# Patient Record
Sex: Female | Born: 1970 | Race: White | Hispanic: No | Marital: Married | Smoking: Heavy tobacco smoker
Health system: Southern US, Community
[De-identification: ages and names within clinical notes are randomized; demographics above are authoritative.]

## PROBLEM LIST (undated history)

## (undated) DIAGNOSIS — M1990 Unspecified osteoarthritis, unspecified site: Secondary | ICD-10-CM

## (undated) DIAGNOSIS — IMO0002 Reserved for concepts with insufficient information to code with codable children: Secondary | ICD-10-CM

## (undated) DIAGNOSIS — R112 Nausea with vomiting, unspecified: Secondary | ICD-10-CM

## (undated) DIAGNOSIS — Z9889 Other specified postprocedural states: Secondary | ICD-10-CM

## (undated) DIAGNOSIS — J449 Chronic obstructive pulmonary disease, unspecified: Secondary | ICD-10-CM

## (undated) DIAGNOSIS — K219 Gastro-esophageal reflux disease without esophagitis: Secondary | ICD-10-CM

## (undated) HISTORY — DX: Gastro-esophageal reflux disease without esophagitis: K21.9

## (undated) HISTORY — PX: BACK SURGERY: SHX140

## (undated) HISTORY — DX: Unspecified osteoarthritis, unspecified site: M19.90

## (undated) HISTORY — PX: NECK SURGERY: SHX720

## (undated) HISTORY — PX: CHOLECYSTECTOMY: SHX55

## (undated) HISTORY — DX: Reserved for concepts with insufficient information to code with codable children: IMO0002

## (undated) HISTORY — PX: ABDOMINAL HYSTERECTOMY: SHX81

---

## 1998-10-20 ENCOUNTER — Other Ambulatory Visit: Admission: RE | Admit: 1998-10-20 | Discharge: 1998-10-20 | Payer: Self-pay | Admitting: Obstetrics & Gynecology

## 1998-10-26 ENCOUNTER — Ambulatory Visit: Admission: RE | Admit: 1998-10-26 | Discharge: 1998-10-26 | Payer: Self-pay | Admitting: Obstetrics and Gynecology

## 1998-11-30 ENCOUNTER — Inpatient Hospital Stay (HOSPITAL_COMMUNITY): Admission: RE | Admit: 1998-11-30 | Discharge: 1998-12-01 | Payer: Self-pay | Admitting: Obstetrics and Gynecology

## 1999-05-14 ENCOUNTER — Emergency Department (HOSPITAL_COMMUNITY): Admission: EM | Admit: 1999-05-14 | Discharge: 1999-05-14 | Payer: Self-pay

## 2000-02-16 ENCOUNTER — Emergency Department (HOSPITAL_COMMUNITY): Admission: EM | Admit: 2000-02-16 | Discharge: 2000-02-16 | Payer: Self-pay | Admitting: *Deleted

## 2000-05-30 ENCOUNTER — Encounter: Admission: RE | Admit: 2000-05-30 | Discharge: 2000-05-30 | Payer: Self-pay | Admitting: Family Medicine

## 2000-05-30 ENCOUNTER — Encounter: Payer: Self-pay | Admitting: Family Medicine

## 2001-10-08 ENCOUNTER — Inpatient Hospital Stay (HOSPITAL_COMMUNITY): Admission: AD | Admit: 2001-10-08 | Discharge: 2001-10-08 | Payer: Self-pay | Admitting: Obstetrics and Gynecology

## 2002-02-21 ENCOUNTER — Emergency Department (HOSPITAL_COMMUNITY): Admission: EM | Admit: 2002-02-21 | Discharge: 2002-02-21 | Payer: Self-pay | Admitting: *Deleted

## 2002-02-27 ENCOUNTER — Encounter: Payer: Self-pay | Admitting: Orthopedic Surgery

## 2002-02-27 ENCOUNTER — Ambulatory Visit (HOSPITAL_COMMUNITY): Admission: RE | Admit: 2002-02-27 | Discharge: 2002-02-27 | Payer: Self-pay | Admitting: Orthopedic Surgery

## 2002-03-18 ENCOUNTER — Encounter: Payer: Self-pay | Admitting: Neurosurgery

## 2002-03-18 ENCOUNTER — Ambulatory Visit (HOSPITAL_COMMUNITY): Admission: RE | Admit: 2002-03-18 | Discharge: 2002-03-19 | Payer: Self-pay | Admitting: Neurosurgery

## 2002-04-08 ENCOUNTER — Encounter: Payer: Self-pay | Admitting: Neurosurgery

## 2002-04-08 ENCOUNTER — Encounter: Admission: RE | Admit: 2002-04-08 | Discharge: 2002-04-08 | Payer: Self-pay | Admitting: Neurosurgery

## 2002-05-10 ENCOUNTER — Encounter: Admission: RE | Admit: 2002-05-10 | Discharge: 2002-05-20 | Payer: Self-pay | Admitting: Neurosurgery

## 2003-03-03 ENCOUNTER — Other Ambulatory Visit: Admission: RE | Admit: 2003-03-03 | Discharge: 2003-03-03 | Payer: Self-pay | Admitting: Obstetrics and Gynecology

## 2004-01-09 ENCOUNTER — Encounter (INDEPENDENT_AMBULATORY_CARE_PROVIDER_SITE_OTHER): Payer: Self-pay | Admitting: Specialist

## 2004-01-09 ENCOUNTER — Observation Stay (HOSPITAL_COMMUNITY): Admission: RE | Admit: 2004-01-09 | Discharge: 2004-01-10 | Payer: Self-pay

## 2006-03-26 ENCOUNTER — Encounter: Admission: RE | Admit: 2006-03-26 | Discharge: 2006-03-26 | Payer: Self-pay | Admitting: Family Medicine

## 2010-05-08 ENCOUNTER — Emergency Department (HOSPITAL_COMMUNITY): Admission: EM | Admit: 2010-05-08 | Discharge: 2010-05-08 | Payer: Self-pay | Admitting: Emergency Medicine

## 2011-05-17 NOTE — Op Note (Signed)
[place]. [facility]  Patient:    Amber Conrad, Amber Conrad Visit Number: [id number] MRN: [id number]          Service Type: DSU Location: 3000 [id number] Attending Physician:  Gerald Dexter Dictated by:   Reinaldo Meeker, M.D. Admit Date:  03/18/2002 Discharge Date: 03/19/2002                             Operative Report  PREOPERATIVE DIAGNOSIS:  Herniated disk, C5-6, right.  POSTOPERATIVE DIAGNOSIS:  Herniated disk, C5-6, right.  PROCEDURE:  C5-6 anterior cervical diskectomy with bone bank fusion, followed by zephyr anterior cervical plating with operating microscope.  SURGEON:  Reinaldo Meeker, M.D.  ASSISTANT:  Donzetta Sprung. Roney Jaffe., M.D.  PROCEDURE IN DETAIL:  After being placed in the supine position with five pounds traction, the patients neck was prepped and draped in the usual sterile fashion.  ______ were taken prior to incision to identify the appropriate level. A transverse incision was made in the right anterior neck, starting at the midline, and heading towards the medial aspect of the sternocleidomastoid muscle.  The platysma muscle was then incised transversely. The natural fascial plane between the strap muscles medially and the sternocleidomastoid laterally was identified and followed down to the anterior aspect of the cervical spine.  The longus colli muscles were identified and split in the midline and ______.  A self-retaining retractor was placed for exposure.  A second x-ray showed the C5-6 level.  Using the 15 blade, the end of the disk was incised.  Using pituitary rongeurs and Pratts, approximately 95% of the disk material was removed.  A high-speed drill was used to widen the interspace.  The microscope was draped on the field and used through the remainder of the case.  Using microdissection technique, the remainder of the disk material down to the posterior longitudinal ligament was removed.  The wound was then incised  transversely, and the cut edges were removed with Kerrison punch.  Inspection was carried out towards the left asymptomatic side until the proximal nerve root could be identified. Inspection towards the right symptomatic side yielded a number of pieces of herniated disk material, particularly one large one wedged within the foramen, and this was removed to get excellent decompression of the underlying C6 nerve root.  At this point, inspection was carried out in all directions without any evidence of residual compression, and none could be identified.  Large amounts of irrigation was carried out.  Any bleeding was controlled with bipolar coagulation and Gelfoam. Measurements were taken, and 7 mm bone bank plugs were reconstituted.  After irrigating once more, confirming hemostasis, the plug was impacted.  Fluoroscopy showed the plug to be in excellent position. An appropriate length Zephyr anterior cervical plate was then chosen.  Under fluoroscopic guidance, drill holes were placed, followed by placement of 13 mm screws x4.  Final fluoroscopy showed the plate and screws to be in excellent position.  At this point, large amounts of irrigation were carried out.  Any bleeding was controlled with bipolar coagulation.  The wound was then closed using interrupted Vicryl on the platysma muscle and inverted 5-0 PDS in the subcuticular layer and Steri-Strips on the skin.  A sterile dressing and a soft collar were applied.  The patient was extubated and taken to the recovery room in stable condition. Dictated by:   Reinaldo Meeker, M.D. Attending Physician:  Gerald Dexter  DD:  03/18/02 TD:  03/21/02 Job: [id number]

## 2011-05-17 NOTE — Op Note (Signed)
Amber Conrad, Amber Conrad                           ACCOUNT NO.:  [id number]   MEDICAL RECORD NO.:  [id number]                   PATIENT TYPE:  AMB   LOCATION:  DAY                                  FACILITY:  [facility]   PHYSICIAN:  Lorre Munroe., M.D.            DATE OF BIRTH:  05-30-1971   DATE OF PROCEDURE:  01/09/2004  DATE OF DISCHARGE:                                 OPERATIVE REPORT   PREOPERATIVE DIAGNOSIS:  Symptomatic gallstones.   POSTOPERATIVE DIAGNOSIS:  Symptomatic gallstones.   OPERATION:  Laparoscopic cholecystectomy.   SURGEON:  Lebron Conners, M.D.   ASSISTANT:  Anselm Pancoast. Zachery Dakins, M.D.   ANESTHESIA:  General.   DESCRIPTION OF PROCEDURE:  After this patient was monitored and anesthetized  and had routine preparation and draping of the abdomen, I infiltrated local  anesthetic just below the umbilicus, then made a short transverse incision  and dissected down to the midline fascia, which I opened longitudinally.  I  then opened the peritoneal cavity bluntly, swept my finger around to assure  that there were no adhesions of viscera to the anterior abdominal wall, and  placed a 0 Vicryl pursestring suture and secured a Hasson cannula.  After  inflating the abdomen with CO2, I inspected its contents and saw no  abnormalities except for slightly distended, slightly edematous gallbladder.  There were some adhesions of the omentum to the undersurface of the  gallbladder.  After placement of three additional ports through anesthetized  sites under direct vision, I grasped the fundus of the gallbladder and  elevated it toward the right shoulder and took down the adhesions.  I then  noted the cystic duct emerging from the infundibulum of the gallbladder,  pulled it laterally, and dissected a window between the liver and the cystic  duct, and I dissected out the cystic artery.  I noted clearly the emergence  of the cystic duct from the gallbladder and noted its entry into  the common  bile duct.  Since the liver tests were normal and there was no history of  jaundice and the stones were known to be pretty large, I decided not to  perform an operative cholangiogram.  I clipped the cystic duct and cystic  artery with three clips each and cut each between the two clips which were  closest to the gallbladder.  I then put one more clip just  below a fairly  large lymph node which was present.  I dissected the gallbladder from the  liver bed using the hook dissector with cautery and gained hemostasis in the  same way.  Hemostasis was excellent throughout.  After almost completing the  dissection, I checked that the clips were secure, and they appeared to be.  I detached the gallbladder from the liver and briefly irrigated the  operative area and removed the irrigant.  I removed the gallbladder from the  body through the umbilical  incision and tied the pursestring suture.  I then  removed the lateral ports under direct vision.  After allowing the CO2 to  escape, I removed the epigastric port.  I closed all skin incisions with  intracuticular 4-0 Vicryl and Steri-Strips.  The patient was stable through  the procedure.                                               Lorre Munroe., M.D.    Jodi Marble  D:  01/09/2004  T:  01/09/2004  Job:  [id number]

## 2011-06-03 ENCOUNTER — Ambulatory Visit (HOSPITAL_COMMUNITY): Admission: RE | Admit: 2011-06-03 | Payer: BC Managed Care – PPO | Source: Ambulatory Visit | Admitting: General Surgery

## 2012-09-30 ENCOUNTER — Ambulatory Visit
Admission: RE | Admit: 2012-09-30 | Discharge: 2012-09-30 | Disposition: A | Discharge status: Home / Self Care | Payer: BC Managed Care – PPO | Source: Ambulatory Visit | Attending: Family Medicine | Admitting: Family Medicine

## 2012-09-30 ENCOUNTER — Other Ambulatory Visit: Payer: Self-pay | Admitting: Family Medicine

## 2012-09-30 DIAGNOSIS — R079 Chest pain, unspecified: Secondary | ICD-10-CM

## 2012-09-30 DIAGNOSIS — M549 Dorsalgia, unspecified: Secondary | ICD-10-CM

## 2012-09-30 DIAGNOSIS — M25559 Pain in unspecified hip: Secondary | ICD-10-CM

## 2013-11-01 ENCOUNTER — Encounter: Payer: Self-pay | Admitting: Podiatry

## 2013-11-01 ENCOUNTER — Ambulatory Visit (INDEPENDENT_AMBULATORY_CARE_PROVIDER_SITE_OTHER): Payer: BC Managed Care – PPO | Admitting: Podiatry

## 2013-11-01 VITALS — BP 113/64 | HR 62 | Resp 16 | Ht 66.0 in | Wt 186.0 lb

## 2013-11-01 DIAGNOSIS — L600 Ingrowing nail: Secondary | ICD-10-CM

## 2013-11-01 NOTE — Patient Instructions (Signed)

## 2013-11-01 NOTE — Progress Notes (Signed)
  Subjective:    Patient ID: Amber Conrad, female    DOB: 05-Jan-1971, 42 y.o.   MRN: [id number] "I have ingrown toenails on both big toenails that I want done, especially the left one."   HPI Comments: N  Throb, ache, tender, sore L  Ingrown Hallux b/l D  Off and on 5 years O  Gradually  C  Gotten worse A  none T  Had left one removed about 2 yrs. ago, I been cutting them, Dr. Tiburcio Pea been looking at them      Review of Systems  Constitutional: Negative.   HENT: Negative.   Eyes: Negative.   Respiratory: Negative.   Cardiovascular: Negative.   Gastrointestinal: Negative.   Endocrine: Negative.   Genitourinary: Negative.   Musculoskeletal: Positive for arthralgias, back pain and neck stiffness.  Skin: Negative.   Allergic/Immunologic: Negative.   Neurological: Negative.   Hematological: Negative.   Psychiatric/Behavioral: Positive for decreased concentration.       Objective:   Physical Exam Orientated x22 year old white female presents with her husband.  Vascular: The DP and PT pulses are two over four bilaterally. Feet are warm to touch bilaterally. Capillary fill is immediate bilaterally.  Dermatological: Incurvation of the medial and lateral borders the right and left hallux toenails noted.  Musculoskeletal: No deformities noted      Assessment & Plan:  Assessment: Ingrowing medial lateral borders the right and left hallux toenails  Plan: Patient offered permanent correction and she verbally consents. Each right and left hallux toe was blocked with 3 cc of 50-50 mixture of 2% plain Xylocaine and 0.5% plain Marcaine. The toes are prepped with Betadine and exsanguinated. The medial and lateral borders of the right and left hallux toenails are excised and a phenol matricectomy performed. The tourniquets were released and spontaneous capillary filling time noted in the hallux bilaterally. Antibiotic dressing applied. Oral and written instructions provided. Patient has  hydrocodone for ongoing pain and will use if needed for her surgical pain. Reappoint when necessary.  Marchelle Rinella C.Leeanne Deed, DPM

## 2014-08-20 ENCOUNTER — Emergency Department (HOSPITAL_COMMUNITY)
Admission: EM | Admit: 2014-08-20 | Discharge: 2014-08-20 | Disposition: A | Discharge status: Home / Self Care | Payer: BC Managed Care – PPO | Attending: Emergency Medicine | Admitting: Emergency Medicine

## 2014-08-20 ENCOUNTER — Emergency Department (HOSPITAL_COMMUNITY): Payer: BC Managed Care – PPO

## 2014-08-20 ENCOUNTER — Encounter (HOSPITAL_COMMUNITY): Payer: Self-pay | Admitting: Emergency Medicine

## 2014-08-20 DIAGNOSIS — K219 Gastro-esophageal reflux disease without esophagitis: Secondary | ICD-10-CM | POA: Insufficient documentation

## 2014-08-20 DIAGNOSIS — M129 Arthropathy, unspecified: Secondary | ICD-10-CM | POA: Insufficient documentation

## 2014-08-20 DIAGNOSIS — S6990XA Unspecified injury of unspecified wrist, hand and finger(s), initial encounter: Secondary | ICD-10-CM | POA: Insufficient documentation

## 2014-08-20 DIAGNOSIS — S6980XA Other specified injuries of unspecified wrist, hand and finger(s), initial encounter: Secondary | ICD-10-CM | POA: Diagnosis present

## 2014-08-20 DIAGNOSIS — Z872 Personal history of diseases of the skin and subcutaneous tissue: Secondary | ICD-10-CM | POA: Diagnosis not present

## 2014-08-20 DIAGNOSIS — F17200 Nicotine dependence, unspecified, uncomplicated: Secondary | ICD-10-CM | POA: Insufficient documentation

## 2014-08-20 DIAGNOSIS — S62521A Displaced fracture of distal phalanx of right thumb, initial encounter for closed fracture: Secondary | ICD-10-CM

## 2014-08-20 DIAGNOSIS — S62639A Displaced fracture of distal phalanx of unspecified finger, initial encounter for closed fracture: Secondary | ICD-10-CM | POA: Diagnosis not present

## 2014-08-20 DIAGNOSIS — Z79899 Other long term (current) drug therapy: Secondary | ICD-10-CM | POA: Insufficient documentation

## 2014-08-20 DIAGNOSIS — S6991XA Unspecified injury of right wrist, hand and finger(s), initial encounter: Secondary | ICD-10-CM
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
## 2014-08-20 NOTE — Discharge Instructions (Signed)
You have evidence of a broken bone near the end of your right thumb.  However, we cannot rule out a broken bone in your hand.  Wear splint for protection and follow up with hand specialist next week for reevaluation.  Follow instruction below.    Thumb Fracture  There are many types of thumb fractures (breaks). There are different ways of treating these fractures, all of which may be correct, varying from case to case. Your caregiver will discuss different ways to treat these fractures with you. TREATMENT   Immobilization. This means the fracture is casted as it is without changing the positions of the fracture (bone pieces) involved. This fracture is casted in a "thumb spica" also called a hitchhiker cast. It is generally left on for 2 to 6 weeks.  Closed reduction. The bones are manipulated back into position without using surgery.  ORIF (open reduction and internal fixation). The fracture site is opened and the bone pieces are fixed into place with some type of hardware such as screws or wires. Your caregiver will discuss the type of fracture you have and the treatment that will be best for that problem. If surgery is the treatment of choice, the following is information for you to know and to let your caregiver know about prior to surgery. LET YOUR CAREGIVERS KNOW ABOUT:  Allergies.  Medications taken including herbs, eye drops, over the counter medications, and creams.  Use of steroids (by mouth or creams).  Previous problems with anesthetics or Novocain.  Family history of anesthetic complications..  Possibility of pregnancy, if this applies.  History of blood clots (thrombophlebitis).  History of bleeding or blood problems.  Previous surgery.  Other health problems. AFTER THE PROCEDURE  After surgery, you will be taken to the recovery area. A nurse will watch and check your progress. Once you are awake, stable, and taking fluids well, barring other problems you will be  allowed to go home. Once home, an ice pack applied to your operative site may help with discomfort and keep the swelling down. Elevate your hand above your heart as much as possible for the first 4-5 days after the injury/surgery. HOME CARE INSTRUCTIONS   Follow your caregiver's instructions as to activities, exercises, physical therapy, and driving a car.  Use thumb and exercise as directed.  Only take over-the-counter or prescription medicines for pain, discomfort, or fever as directed by your caregiver. Do not take aspirin until your caregiver instructs. This can increase bleeding immediately following surgery. SEEK MEDICAL CARE IF:   There is increased bleeding (more than a small spot) from the wound or from beneath your cast or splint.  There is redness, swelling, or increasing pain in the wound or from beneath your cast or splint.  You have pus coming from wound or from beneath your cast or splint.  An unexplained oral temperature above 102 F (38.9 C) develops.  There is a foul smell coming from the wound or dressing or from beneath your cast or splint. SEEK IMMEDIATE MEDICAL CARE IF:   You develop severe pain, decreased sensation such as numbness or tingling.  You develop a rash.  You have difficulty breathing.  Youhave any allergic problems. If you do not have a window in your cast for observing the wound, a discharge or minor bleeding may show up as a stain on the outside of your cast. Report these findings to your caregiver. If you have a removable splint overlying the surgical dressings it is common to  see a small amount of bleeding. Change the dressings as instructed by your caregiver. Document Released: 09/14/2003 Document Revised: 03/09/2012 Document Reviewed: 02/18/2014 [facility] Patient Information 2015 [place], [place]. This information is not intended to replace advice given to you by your health care provider. Make sure you discuss any questions you have with your  health care provider.
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
## 2014-08-30 NOTE — ED Provider Notes (Signed)
Medical screening examination/treatment/procedure(s) were performed by non-physician practitioner and as supervising physician I was immediately available for consultation/collaboration.   EKG Interpretation None       Babette Relic, MD 08/30/14 2045

## 2015-01-25 ENCOUNTER — Other Ambulatory Visit: Payer: Self-pay | Admitting: Family Medicine

## 2015-01-25 DIAGNOSIS — R1030 Lower abdominal pain, unspecified: Secondary | ICD-10-CM

## 2015-01-26 ENCOUNTER — Ambulatory Visit
Admission: RE | Admit: 2015-01-26 | Discharge: 2015-01-26 | Disposition: A | Discharge status: Home / Self Care | Payer: BLUE CROSS/BLUE SHIELD | Source: Ambulatory Visit | Attending: Family Medicine | Admitting: Family Medicine

## 2015-01-26 DIAGNOSIS — R1030 Lower abdominal pain, unspecified: Secondary | ICD-10-CM

## 2015-01-30 ENCOUNTER — Other Ambulatory Visit: Payer: Self-pay | Admitting: Gastroenterology

## 2015-01-30 ENCOUNTER — Other Ambulatory Visit: Payer: Self-pay | Admitting: Family Medicine

## 2015-01-30 DIAGNOSIS — D3500 Benign neoplasm of unspecified adrenal gland: Secondary | ICD-10-CM

## 2015-02-01 ENCOUNTER — Ambulatory Visit
Admission: RE | Admit: 2015-02-01 | Discharge: 2015-02-01 | Disposition: A | Discharge status: Home / Self Care | Payer: BLUE CROSS/BLUE SHIELD | Source: Ambulatory Visit | Attending: Family Medicine | Admitting: Family Medicine

## 2015-02-01 DIAGNOSIS — D3500 Benign neoplasm of unspecified adrenal gland: Secondary | ICD-10-CM

## 2019-06-03 ENCOUNTER — Other Ambulatory Visit: Payer: Self-pay | Admitting: Sports Medicine

## 2019-06-03 DIAGNOSIS — M545 Low back pain, unspecified: Secondary | ICD-10-CM

## 2019-06-18 ENCOUNTER — Ambulatory Visit
Admission: RE | Admit: 2019-06-18 | Discharge: 2019-06-18 | Disposition: A | Discharge status: Home / Self Care | Payer: BLUE CROSS/BLUE SHIELD | Source: Ambulatory Visit | Attending: Sports Medicine | Admitting: Sports Medicine

## 2019-06-18 ENCOUNTER — Other Ambulatory Visit: Payer: Self-pay

## 2019-06-18 DIAGNOSIS — M545 Low back pain, unspecified: Secondary | ICD-10-CM

## 2019-08-06 ENCOUNTER — Other Ambulatory Visit: Payer: Self-pay | Admitting: Neurological Surgery

## 2019-08-06 DIAGNOSIS — M542 Cervicalgia: Secondary | ICD-10-CM

## 2019-08-12 ENCOUNTER — Other Ambulatory Visit: Payer: Self-pay | Admitting: Family Medicine

## 2019-08-18 ENCOUNTER — Other Ambulatory Visit: Payer: Self-pay | Admitting: Family Medicine

## 2019-08-18 DIAGNOSIS — R221 Localized swelling, mass and lump, neck: Secondary | ICD-10-CM

## 2019-08-20 ENCOUNTER — Ambulatory Visit
Admission: RE | Admit: 2019-08-20 | Discharge: 2019-08-20 | Disposition: A | Discharge status: Home / Self Care | Payer: 59 | Source: Ambulatory Visit | Attending: Family Medicine | Admitting: Family Medicine

## 2019-08-20 DIAGNOSIS — R221 Localized swelling, mass and lump, neck: Secondary | ICD-10-CM

## 2019-08-20 MED ORDER — IOPAMIDOL (ISOVUE-300) INJECTION 61%
75.0000 mL | Freq: Once | INTRAVENOUS | Status: AC | PRN
  Administered 2019-08-20: 75 mL via INTRAVENOUS

## 2019-09-04 ENCOUNTER — Other Ambulatory Visit: Payer: Self-pay

## 2019-09-04 ENCOUNTER — Ambulatory Visit
Admission: RE | Admit: 2019-09-04 | Discharge: 2019-09-04 | Disposition: A | Discharge status: Home / Self Care | Payer: 59 | Source: Ambulatory Visit | Attending: Neurological Surgery | Admitting: Neurological Surgery

## 2019-09-04 DIAGNOSIS — M542 Cervicalgia: Secondary | ICD-10-CM

## 2019-10-18 ENCOUNTER — Other Ambulatory Visit: Payer: Self-pay | Admitting: Neurological Surgery

## 2019-10-18 DIAGNOSIS — M5414 Radiculopathy, thoracic region: Secondary | ICD-10-CM

## 2019-10-26 ENCOUNTER — Other Ambulatory Visit: Payer: 59

## 2019-10-28 ENCOUNTER — Other Ambulatory Visit: Payer: 59

## 2019-11-06 ENCOUNTER — Other Ambulatory Visit: Payer: Self-pay

## 2019-11-06 ENCOUNTER — Ambulatory Visit
Admission: RE | Admit: 2019-11-06 | Discharge: 2019-11-06 | Disposition: A | Discharge status: Home / Self Care | Payer: 59 | Source: Ambulatory Visit | Attending: Neurological Surgery | Admitting: Neurological Surgery

## 2019-11-06 DIAGNOSIS — M5414 Radiculopathy, thoracic region: Secondary | ICD-10-CM

## 2019-11-09 ENCOUNTER — Other Ambulatory Visit: Payer: 59

## 2020-06-08 ENCOUNTER — Other Ambulatory Visit: Payer: Self-pay | Admitting: Family Medicine

## 2020-06-08 DIAGNOSIS — K6289 Other specified diseases of anus and rectum: Secondary | ICD-10-CM

## 2020-06-08 DIAGNOSIS — R0989 Other specified symptoms and signs involving the circulatory and respiratory systems: Secondary | ICD-10-CM

## 2020-06-26 ENCOUNTER — Other Ambulatory Visit: Payer: Self-pay

## 2020-06-26 ENCOUNTER — Ambulatory Visit
Admission: RE | Admit: 2020-06-26 | Discharge: 2020-06-26 | Disposition: A | Discharge status: Home / Self Care | Payer: 59 | Source: Ambulatory Visit | Attending: Family Medicine | Admitting: Family Medicine

## 2020-06-26 DIAGNOSIS — R0989 Other specified symptoms and signs involving the circulatory and respiratory systems: Secondary | ICD-10-CM

## 2020-06-26 MED ORDER — IOPAMIDOL (ISOVUE-300) INJECTION 61%
75.0000 mL | Freq: Once | INTRAVENOUS | Status: AC | PRN
  Administered 2020-06-26: 75 mL via INTRAVENOUS

## 2020-10-11 ENCOUNTER — Emergency Department (HOSPITAL_COMMUNITY)
Admission: EM | Admit: 2020-10-11 | Discharge: 2020-10-12 | Disposition: A | Discharge status: Home / Self Care | Payer: Self-pay | Attending: Emergency Medicine | Admitting: Emergency Medicine

## 2020-10-11 DIAGNOSIS — M791 Myalgia, unspecified site: Secondary | ICD-10-CM | POA: Insufficient documentation

## 2020-10-11 DIAGNOSIS — M542 Cervicalgia: Secondary | ICD-10-CM | POA: Insufficient documentation

## 2020-10-11 DIAGNOSIS — M549 Dorsalgia, unspecified: Secondary | ICD-10-CM | POA: Insufficient documentation

## 2020-10-11 DIAGNOSIS — Z5321 Procedure and treatment not carried out due to patient leaving prior to being seen by health care provider: Secondary | ICD-10-CM | POA: Insufficient documentation

## 2020-10-12 ENCOUNTER — Other Ambulatory Visit: Payer: Self-pay

## 2020-10-12 ENCOUNTER — Encounter (HOSPITAL_COMMUNITY): Payer: Self-pay | Admitting: *Deleted

## 2020-10-12 NOTE — ED Triage Notes (Signed)
The pt is c/o back pain lumbar spine pain neck pain since Sunday. No injury  She asked the wait time  She reports that she is not going to wait for hours and hours  She left without being seen

## 2021-12-03 ENCOUNTER — Other Ambulatory Visit: Payer: Self-pay | Admitting: Family Medicine

## 2021-12-03 DIAGNOSIS — M542 Cervicalgia: Secondary | ICD-10-CM

## 2021-12-11 ENCOUNTER — Other Ambulatory Visit: Payer: Self-pay

## 2021-12-11 ENCOUNTER — Ambulatory Visit
Admission: RE | Admit: 2021-12-11 | Discharge: 2021-12-11 | Disposition: A | Discharge status: Home / Self Care | Payer: Medicare HMO | Source: Ambulatory Visit | Attending: Family Medicine | Admitting: Family Medicine

## 2021-12-11 DIAGNOSIS — M542 Cervicalgia: Secondary | ICD-10-CM

## 2022-06-24 IMAGING — MR MR CERVICAL SPINE W/O CM
5 of 7 series · 33 of 48 positions shown · non-contrast
Comparison: MRI of the cervical spine 09/04/2019

CLINICAL DATA: Posterior neck pain.  Left-sided neck pain.

EXAM:
MRI CERVICAL SPINE WITHOUT CONTRAST
TECHNIQUE: Multiplanar, multisequence MR imaging of the cervical spine was
performed. No intravenous contrast was administered.

[Series 5: T2 · sagittal · 3.0mm · 0.66mm/px · 6 of 18 slices shown (1 of 3)]
[im 1/18]
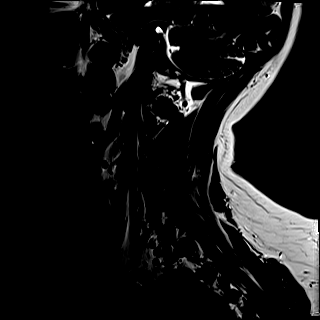
[im 4/18]
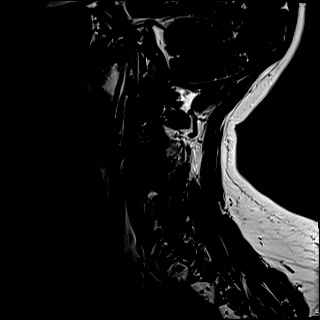
[im 7/18]
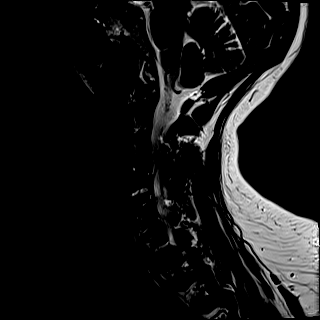
[im 11/18]
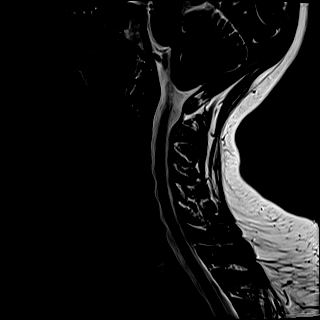
[im 14/18]
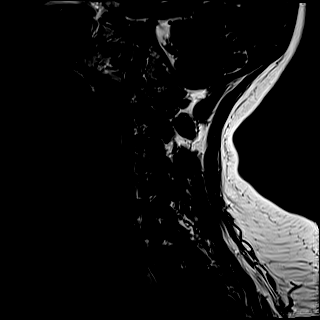
[im 18/18]
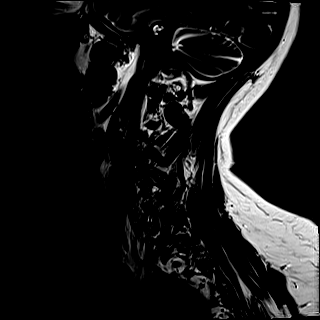

[Series 6: T1 · sagittal · 3.0mm · 0.66mm/px · 5 of 18 slices shown]
[im 1/18]
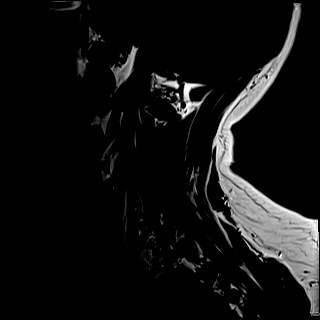
[im 5/18]
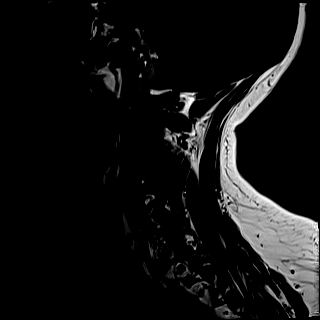
[im 9/18]
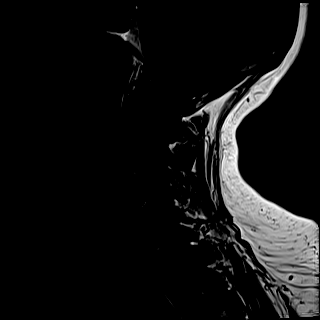
[im 13/18]
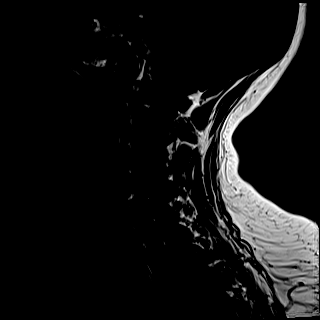
[im 18/18]
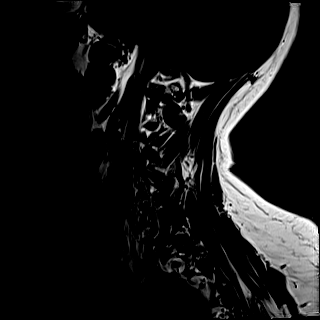

[Series 7: STIR · sagittal · 3.0mm · 0.33mm/px · 4 of 18 slices shown]
[im 1/18]
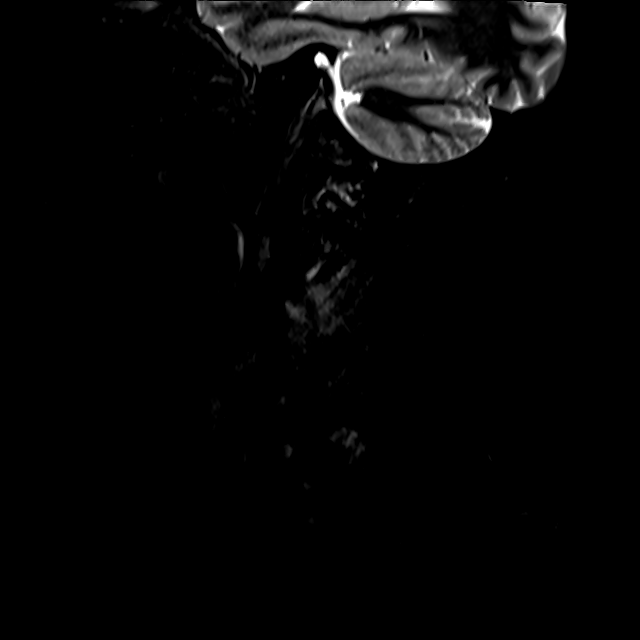
[im 5/18]
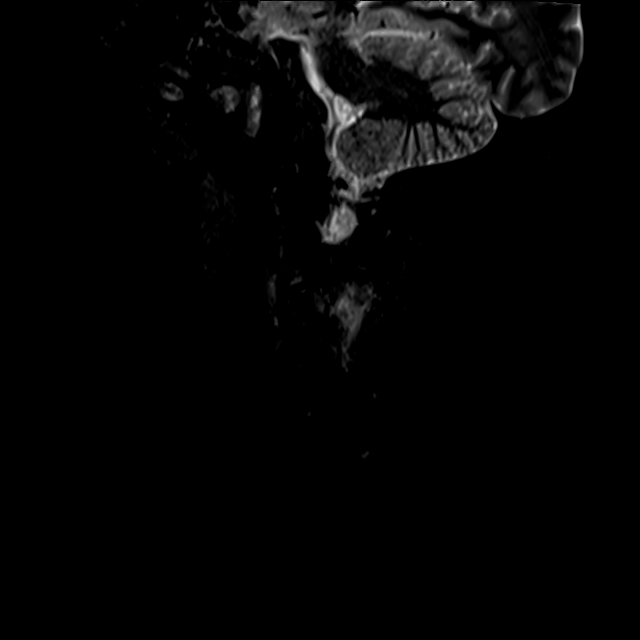
[im 9/18]
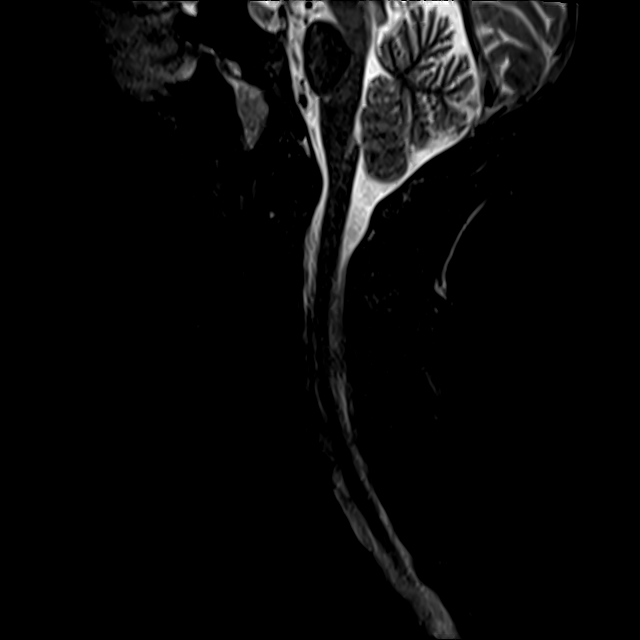
[im 13/18]
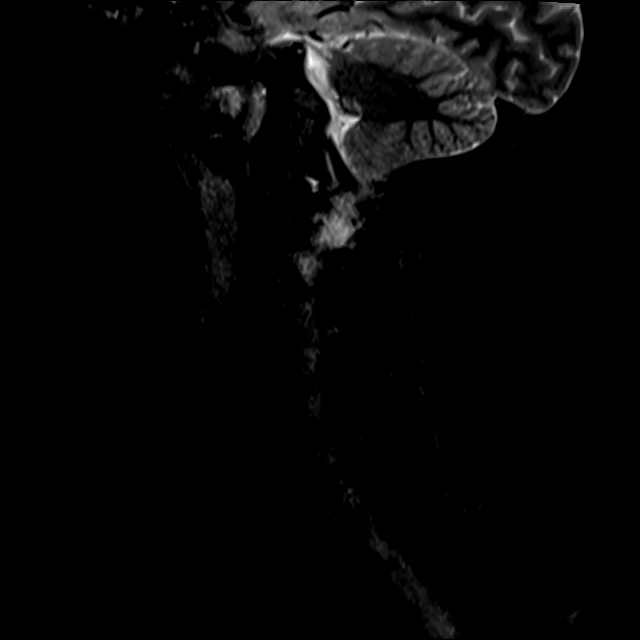

[Series 8: T2 · axial · 3.0mm · 0.50mm/px · z∈[-85,+15]mm · 9 of 32 slices shown (2 of 3)]
[im 1/32]
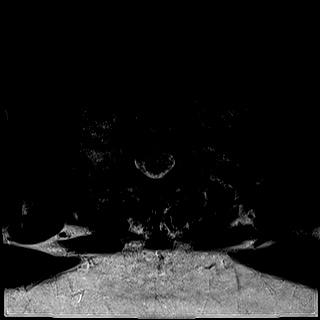
[im 4/32]
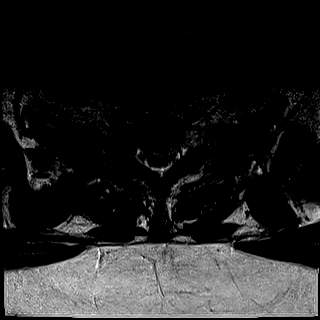
[im 8/32]
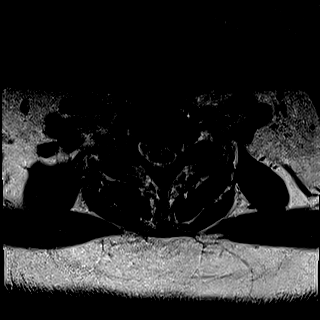
[im 12/32]
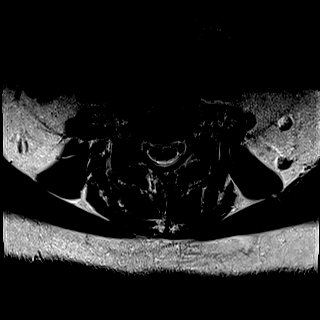
[im 16/32]
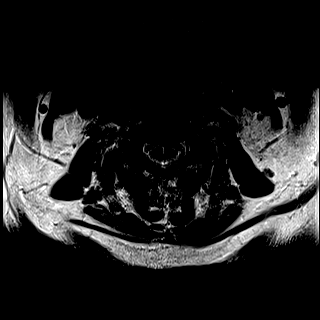
[im 20/32]
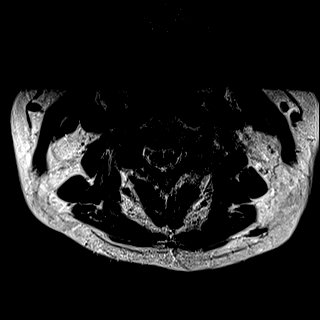
[im 24/32]
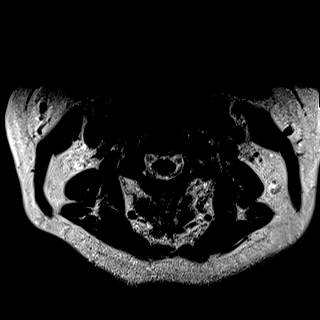
[im 28/32]
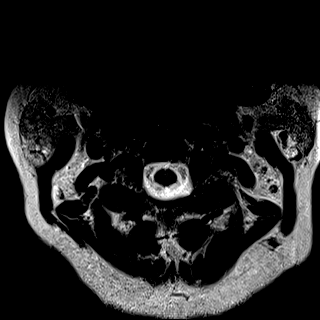
[im 32/32]
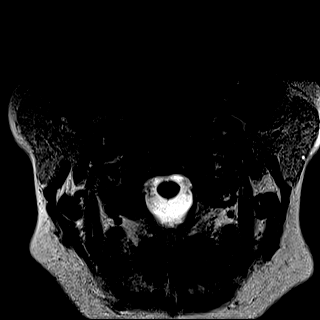

[Series 11: T2 · axial · 3.0mm · 0.50mm/px · z∈[-85,+15]mm · 9 of 32 slices shown (3 of 3)]
[im 1/32]
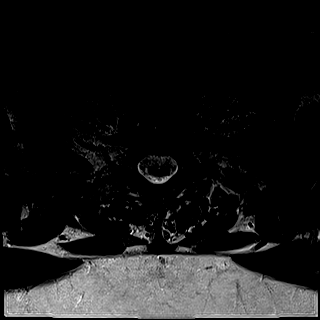
[im 4/32]
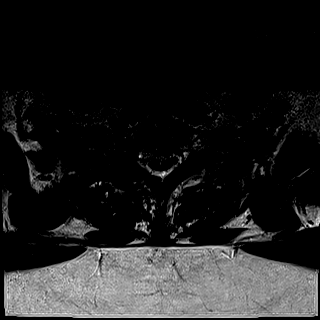
[im 8/32]
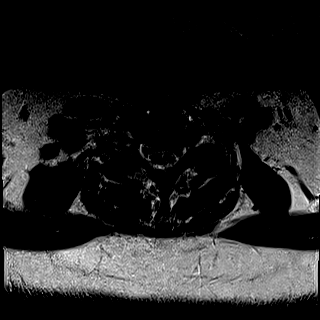
[im 12/32]
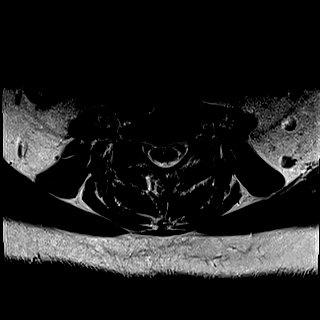
[im 16/32]
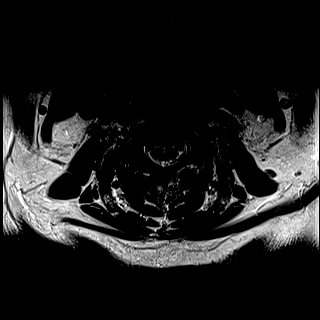
[im 20/32]
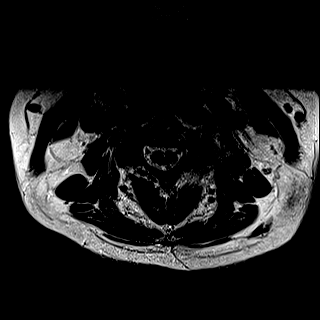
[im 24/32]
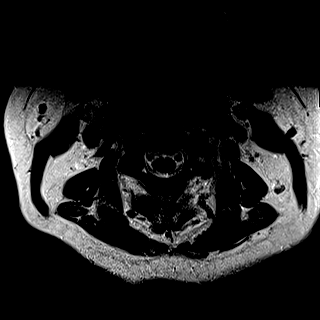
[im 28/32]
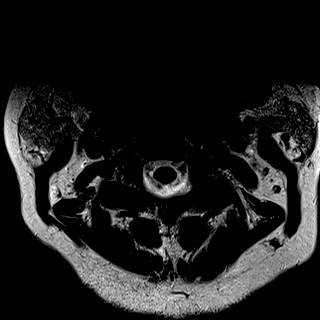
[im 32/32]
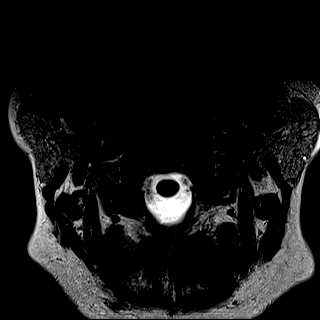

[33 of 48 positions shown; findings below may reference images not displayed]

FINDINGS: Alignment: No significant listhesis is present. Cervical lordosis is
preserved.

Vertebrae: T1 hemangioma again noted. Chronic fatty endplate marrow
changes C6-7 are stable. Marrow signal and vertebral body heights
are otherwise normal.

Cord: Normal signal and morphology.

Posterior Fossa, vertebral arteries, paraspinal tissues: The
cerebellar tonsils extend to the foramen magnum. A rounded. Adequate
CSF space surrounds. No significant Chiari malformation.
Craniocervical junction is otherwise normal. Flow is present in the
vertebral arteries bilaterally. Visualized intracranial contents are
normal.

Disc levels:

C2-3: Asymmetric left-sided uncovertebral and facet hypertrophy
results in moderate left foraminal narrowing.

C3-4: A broad-based disc osteophyte complex is present.
Uncovertebral and facet hypertrophy results in moderate left
foraminal narrowing.

C4-5: A broad-based disc osteophyte complex present. Stable mild
foraminal narrowing is due to uncovertebral disease.

C5-6: Solid anterior fusion is present. No residual or recurrent
stenosis is present.

C6-7: A leftward disc osteophyte complex is present. Progressive
moderate to severe left foraminal narrowing is present. Partial
effacement of the ventral CSF is noted. Mild right foraminal
narrowing is stable.

C7-T1: Negative.
IMPRESSION: 1. Solid anterior fusion at C5-6 without residual or recurrent
stenosis.
2. Progressive moderate to severe left foraminal stenosis at C6-7
secondary to a leftward disc osteophyte complex.
3. Stable moderate left foraminal narrowing at C2-3 and C3-4.
4. Stable mild foraminal narrowing bilaterally at C4-5.

## 2022-08-29 ENCOUNTER — Other Ambulatory Visit: Payer: Self-pay | Admitting: Neurological Surgery

## 2022-08-29 DIAGNOSIS — M5416 Radiculopathy, lumbar region: Secondary | ICD-10-CM

## 2022-09-18 ENCOUNTER — Ambulatory Visit
Admission: RE | Admit: 2022-09-18 | Discharge: 2022-09-18 | Disposition: A | Discharge status: Home / Self Care | Payer: Medicare HMO | Source: Ambulatory Visit | Attending: Neurological Surgery | Admitting: Neurological Surgery

## 2022-09-18 DIAGNOSIS — M5416 Radiculopathy, lumbar region: Secondary | ICD-10-CM

## 2022-09-18 MED ORDER — GADOBENATE DIMEGLUMINE 529 MG/ML IV SOLN
20.0000 mL | Freq: Once | INTRAVENOUS | Status: AC | PRN
  Administered 2022-09-18: 20 mL via INTRAVENOUS

## 2022-10-01 ENCOUNTER — Other Ambulatory Visit: Payer: Self-pay | Admitting: Neurological Surgery

## 2022-10-09 NOTE — Pre-Procedure Instructions (Signed)
Surgical Instructions    Your procedure is scheduled on Monday, October 16.  Report to [facility] Main Entrance "A" at 5:30 A.M., then check in with the Admitting office.  Call this number if you have problems the morning of surgery:  [phone]   If you have any questions prior to your surgery date call [phone]: Open Monday-Friday 8am-4pm If you experience any cold or flu symptoms such as cough, fever, chills, shortness of breath, etc. between now and your scheduled surgery, please notify us at the above number     Remember:  Do not eat or drink after midnight the night before your surgery    Take these medicines the morning of surgery with A SIP OF WATER:  amoxicillin-clavulanate (AUGMENTIN)  omeprazole (PRILOSEC)  Budeson-Glycopyrrol-Formoterol (BREZTRI AEROSPHERE)  albuterol (VENTOLIN HFA) 108 (90 Base) MCG/ACT inhaler if needed doxycycline (VIBRA-TABS)  if needed HYDROcodone-acetaminophen ([place])  if needed   As of today, STOP taking any Aspirin (unless otherwise instructed by your surgeon), meloxicam (MOBIC), Aleve, Naproxen, Ibuprofen, Motrin, Advil, Goody's, BC's, all herbal medications, fish oil, and all vitamins.   [facility] is not responsible for any belongings or valuables.    Do NOT Smoke (Tobacco/Vaping)  24 hours prior to your procedure  If you use a CPAP at night, you may bring your mask for your overnight stay.   Contacts, glasses, hearing aids, dentures or partials may not be worn into surgery, please bring cases for these belongings   For patients admitted to the hospital, discharge time will be determined by your treatment team.   Patients discharged the day of surgery will not be allowed to drive home, and someone needs to stay with them for 24 hours.   SURGICAL WAITING ROOM VISITATION Patients having surgery or a procedure may have no more than 2 support people in the waiting area - these visitors may rotate.   Children under the age of 24  must have an adult with them who is not the patient. If the patient needs to stay at the hospital during part of their recovery, the visitor guidelines for inpatient rooms apply. Pre-op nurse will coordinate an appropriate time for 1 support person to accompany patient in pre-op.  This support person may not rotate.   Please refer to the [facility] website for the visitor guidelines for Inpatients (after your surgery is over and you are in a regular room).    Special instructions:    Oral Hygiene is also important to reduce your risk of infection.  Remember - BRUSH YOUR TEETH THE MORNING OF SURGERY WITH YOUR REGULAR TOOTHPASTE   [place]- Preparing For Surgery  Before surgery, you can play an important role. Because skin is not sterile, your skin needs to be as free of germs as possible. You can reduce the number of germs on your skin by washing with CHG (chlorahexidine gluconate) Soap before surgery.  CHG is an antiseptic cleaner which kills germs and bonds with the skin to continue killing germs even after washing.     Please do not use if you have an allergy to CHG or antibacterial soaps. If your skin becomes reddened/irritated stop using the CHG.  Do not shave (including legs and underarms) for at least 48 hours prior to first CHG shower. It is OK to shave your face.  Please follow these instructions carefully.     Shower the NIGHT BEFORE SURGERY and the MORNING OF SURGERY with CHG Soap.   If you chose to wash  your hair, wash your hair first as usual with your normal shampoo. After you shampoo, rinse your hair and body thoroughly to remove the shampoo.  Then [employer] and genitals (private parts) with your normal soap and rinse thoroughly to remove soap.  After that Use CHG Soap as you would any other liquid soap. You can apply CHG directly to the skin and wash gently with a scrungie or a clean washcloth.   Apply the CHG Soap to your body ONLY FROM THE NECK DOWN.  Do not use on  open wounds or open sores. Avoid contact with your eyes, ears, mouth and genitals (private parts). Wash Face and genitals (private parts)  with your normal soap.   Wash thoroughly, paying special attention to the area where your surgery will be performed.  Thoroughly rinse your body with warm water from the neck down.  DO NOT shower/wash with your normal soap after using and rinsing off the CHG Soap.  Pat yourself dry with a CLEAN TOWEL.  Wear CLEAN PAJAMAS to bed the night before surgery  Place CLEAN SHEETS on your bed the night before your surgery  DO NOT SLEEP WITH PETS.   Day of Surgery:  Take a shower with CHG soap. Wear Clean/Comfortable clothing the morning of surgery Do not wear jewelry or makeup. Do not wear lotions, powders, perfumes/cologne or deodorant. Do not shave 48 hours prior to surgery.  Men may shave face and neck. Do not bring valuables to the hospital. Do not wear nail polish, gel polish, artificial nails, or any other type of covering on natural nails (fingers and toes) If you have artificial nails or gel coating that need to be removed by a nail salon, please have this removed prior to surgery. Artificial nails or gel coating may interfere with anesthesia's ability to adequately monitor your vital signs. Remember to brush your teeth WITH YOUR REGULAR TOOTHPASTE.    If you received a COVID test during your pre-op visit, it is requested that you wear a mask when out in public, stay away from anyone that may not be feeling well, and notify your surgeon if you develop symptoms. If you have been in contact with anyone that has tested positive in the last 10 days, please notify your surgeon.    Please read over the following fact sheets that you were given.

## 2022-10-10 ENCOUNTER — Other Ambulatory Visit: Payer: Self-pay

## 2022-10-10 ENCOUNTER — Encounter (HOSPITAL_COMMUNITY): Payer: Self-pay

## 2022-10-10 ENCOUNTER — Encounter (HOSPITAL_COMMUNITY)
Admission: RE | Admit: 2022-10-10 | Discharge: 2022-10-10 | Disposition: A | Discharge status: Home / Self Care | Payer: Medicare HMO | Source: Ambulatory Visit | Attending: Neurological Surgery | Admitting: Neurological Surgery

## 2022-10-10 VITALS — BP 114/71 | HR 70 | Temp 98.1°F | Resp 17 | Ht 66.0 in | Wt 229.4 lb

## 2022-10-10 DIAGNOSIS — F17200 Nicotine dependence, unspecified, uncomplicated: Secondary | ICD-10-CM | POA: Diagnosis not present

## 2022-10-10 DIAGNOSIS — J439 Emphysema, unspecified: Secondary | ICD-10-CM | POA: Diagnosis not present

## 2022-10-10 DIAGNOSIS — Z01812 Encounter for preprocedural laboratory examination: Secondary | ICD-10-CM | POA: Diagnosis present

## 2022-10-10 DIAGNOSIS — Z01818 Encounter for other preprocedural examination: Secondary | ICD-10-CM

## 2022-10-10 HISTORY — DX: Nausea with vomiting, unspecified: R11.2

## 2022-10-10 HISTORY — DX: Nausea with vomiting, unspecified: Z98.890

## 2022-10-10 HISTORY — DX: Other specified postprocedural states: Z98.890

## 2022-10-10 HISTORY — DX: Chronic obstructive pulmonary disease, unspecified: J44.9

## 2022-10-10 LAB — CBC
HCT: 46.7 % — ABNORMAL HIGH (ref 36.0–46.0)
Hemoglobin: 15.2 g/dL — ABNORMAL HIGH (ref 12.0–15.0)
MCH: 29.2 pg (ref 26.0–34.0)
MCHC: 32.5 g/dL (ref 30.0–36.0)
MCV: 89.8 fL (ref 80.0–100.0)
Platelets: 526 10*3/uL — ABNORMAL HIGH (ref 150–400)
RBC: 5.2 MIL/uL — ABNORMAL HIGH (ref 3.87–5.11)
RDW: 13.8 % (ref 11.5–15.5)
WBC: 15 10*3/uL — ABNORMAL HIGH (ref 4.0–10.5)
nRBC: 0 % (ref 0.0–0.2)

## 2022-10-10 LAB — SURGICAL PCR SCREEN
MRSA, PCR: NEGATIVE
Staphylococcus aureus: NEGATIVE

## 2022-10-10 LAB — PROTIME-INR
INR: 1 (ref 0.8–1.2)
Prothrombin Time: 12.7 seconds (ref 11.4–15.2)

## 2022-10-10 NOTE — Progress Notes (Addendum)
PCP - Claris Gower MD Cardiologist - denies  PPM/ICD - denies  Chest x-ray - n/a EKG - n/a Stress Test - "years and years and years ago with Dr. Wynonia Lawman" ECHO - denies Cardiac Cath - denies  Sleep Study - denies  No diabetes.  As of today, STOP taking any Aspirin (unless otherwise instructed by your surgeon), meloxicam (MOBIC), Aleve, Naproxen, Ibuprofen, Motrin, Advil, Goody's, BC's, all herbal medications, fish oil, and all vitamins.  ERAS Protcol - no PRE-SURGERY Ensure or G2- n/a  COVID TEST- n/a   Anesthesia review: Yes- WBC 15. Pt endorses taking augmentin for a cold. Course to complete 10/15  Patient denies shortness of breath, fever, cough and chest pain at PAT appointment   All instructions explained to the patient, with a verbal understanding of the material. Patient agrees to go over the instructions while at home for a better understanding. Patient also instructed to self quarantine after being tested for COVID-19. The opportunity to ask questions was provided.

## 2022-10-11 NOTE — Anesthesia Preprocedure Evaluation (Signed)
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  Reproductive/Obstetrics negative OB ROS                           Anesthesia Physical Anesthesia Plan  ASA: 3  Anesthesia Plan: General   Post-op Pain Management: Tylenol PO (pre-op)* and Ketamine IV*   Induction: Intravenous  PONV Risk Score and Plan: 4 or greater and Treatment may vary due to age or medical condition, Ondansetron, Dexamethasone, Midazolam and Scopolamine patch - Pre-op  Airway Management Planned: Oral ETT  Additional Equipment: None  Intra-op Plan:   Post-operative Plan: Extubation in OR  Informed Consent: I have reviewed the patients History and Physical, chart, labs and discussed the procedure including the risks, benefits and alternatives for the proposed anesthesia with the patient or authorized representative who has indicated his/her understanding and acceptance.     Dental advisory given  Plan Discussed with: CRNA  Anesthesia Plan Comments: (See PAT note 10/11/22 by Karoline Caldwell, PA-C )       Anesthesia Quick Evaluation

## 2022-10-11 NOTE — Progress Notes (Signed)
Anesthesia Chart Review:  I called and spoke with the patient regarding her recent respiratory illness.  She states that 2 to 3 weeks ago she developed a productive cough and after about 5 to 7 days of this she was seen by her PCP on 10/07/2022 and started on a 7-day course of Augmentin for what sounds like a COPD exacerbation.  She is a heavy smoker, 1.5 pack/day, and has associated COPD/emphysema.  She says she has a "smoker's cough" at baseline. She states she gets treated for "chest infections" about twice per year.  She is using her albuterol inhaler twice daily as well as her daily maintenance inhaler Breztri Aerosphere. She reports that her symptoms are significantly improved and her cough is essentially back to baseline now.  She denies any purulent sputum.  Denies any fever, headache, nausea vomiting diarrhea, or other signs/symptoms of systemic illness. Overall she says she feels well.  Reviewed recent illness with anesthesiologist Dr. Tobias Alexander.  Advised patient can proceed as planned will need day of surgery evaluation by standing and anesthesiologist.  History of C5-6 ACDF.  Preop labs reviewed, mildly elevated WBC of 15.0, mildly elevated platelets of 526k, otherwise unremarkable.   Wynonia Musty [facility] Short Stay Center/Anesthesiology Phone [phone] 10/11/2022 3:51 PM

## 2022-10-14 ENCOUNTER — Ambulatory Visit (HOSPITAL_COMMUNITY): Payer: Medicare HMO

## 2022-10-14 ENCOUNTER — Encounter (HOSPITAL_COMMUNITY): Payer: Self-pay | Admitting: Neurological Surgery

## 2022-10-14 ENCOUNTER — Ambulatory Visit (HOSPITAL_COMMUNITY): Payer: Medicare HMO | Admitting: Physician Assistant

## 2022-10-14 ENCOUNTER — Other Ambulatory Visit: Payer: Self-pay

## 2022-10-14 ENCOUNTER — Ambulatory Visit (HOSPITAL_BASED_OUTPATIENT_CLINIC_OR_DEPARTMENT_OTHER): Payer: Medicare HMO | Admitting: Anesthesiology

## 2022-10-14 ENCOUNTER — Ambulatory Visit (HOSPITAL_COMMUNITY)
Admission: RE | Admit: 2022-10-14 | Discharge: 2022-10-15 | Disposition: A | Discharge status: Home / Self Care | Payer: Medicare HMO | Attending: Neurological Surgery | Admitting: Neurological Surgery

## 2022-10-14 ENCOUNTER — Ambulatory Visit (HOSPITAL_COMMUNITY)
Admission: RE | Disposition: A | Discharge status: Home / Self Care | Payer: Self-pay | Source: Home / Self Care | Attending: Neurological Surgery

## 2022-10-14 DIAGNOSIS — K219 Gastro-esophageal reflux disease without esophagitis: Secondary | ICD-10-CM | POA: Insufficient documentation

## 2022-10-14 DIAGNOSIS — F17210 Nicotine dependence, cigarettes, uncomplicated: Secondary | ICD-10-CM

## 2022-10-14 DIAGNOSIS — Z9889 Other specified postprocedural states: Secondary | ICD-10-CM

## 2022-10-14 DIAGNOSIS — M5117 Intervertebral disc disorders with radiculopathy, lumbosacral region: Secondary | ICD-10-CM | POA: Diagnosis present

## 2022-10-14 DIAGNOSIS — J449 Chronic obstructive pulmonary disease, unspecified: Secondary | ICD-10-CM

## 2022-10-14 DIAGNOSIS — Z981 Arthrodesis status: Secondary | ICD-10-CM | POA: Diagnosis not present

## 2022-10-14 DIAGNOSIS — M1990 Unspecified osteoarthritis, unspecified site: Secondary | ICD-10-CM | POA: Diagnosis not present

## 2022-10-14 DIAGNOSIS — R2681 Unsteadiness on feet: Secondary | ICD-10-CM | POA: Diagnosis not present

## 2022-10-14 HISTORY — PX: LUMBAR LAMINECTOMY/DECOMPRESSION MICRODISCECTOMY: SHX5026

## 2022-10-14 SURGERY — LUMBAR LAMINECTOMY/DECOMPRESSION MICRODISCECTOMY 1 LEVEL
Anesthesia: General | Site: Back | Laterality: Left

## 2022-10-14 MED ORDER — MENTHOL 3 MG MT LOZG: 1.0000 | LOZENGE | OROMUCOSAL | Status: DC | PRN

## 2022-10-14 MED ORDER — ACETAMINOPHEN 500 MG PO TABS: 1000.0000 mg | ORAL_TABLET | ORAL | Status: AC

## 2022-10-14 MED ORDER — MIDAZOLAM HCL 2 MG/2ML IJ SOLN
INTRAMUSCULAR | Status: DC | PRN
  Administered 2022-10-14: 2 mg via INTRAVENOUS

## 2022-10-14 MED ORDER — CEFAZOLIN SODIUM-DEXTROSE 1-4 GM/50ML-% IV SOLN
1.0000 g | Freq: Three times a day (TID) | INTRAVENOUS | Status: AC
  Administered 2022-10-14: 1 g via INTRAVENOUS

## 2022-10-14 MED ORDER — FENTANYL CITRATE (PF) 250 MCG/5ML IJ SOLN
INTRAMUSCULAR | Status: AC
  Filled 2022-10-14: qty 5

## 2022-10-14 MED ORDER — ACETAMINOPHEN 500 MG PO TABS: 1000.0000 mg | ORAL_TABLET | Freq: Once | ORAL | Status: DC

## 2022-10-14 MED ORDER — THROMBIN (RECOMBINANT) 5000 UNITS EX SOLR
CUTANEOUS | Status: DC | PRN
  Administered 2022-10-14: 10 mL via TOPICAL

## 2022-10-14 MED ORDER — ONDANSETRON HCL 4 MG/2ML IJ SOLN
INTRAMUSCULAR | Status: AC
  Filled 2022-10-14: qty 2

## 2022-10-14 MED ORDER — SCOPOLAMINE 1 MG/3DAYS TD PT72: 1.0000 | MEDICATED_PATCH | Freq: Once | TRANSDERMAL | Status: DC

## 2022-10-14 MED ORDER — MELOXICAM 7.5 MG PO TABS
7.5000 mg | ORAL_TABLET | Freq: Two times a day (BID) | ORAL | Status: DC
  Administered 2022-10-14: 7.5 mg via ORAL
  Filled 2022-10-14 (×2): qty 1

## 2022-10-14 MED ORDER — PROPOFOL 10 MG/ML IV BOLUS
INTRAVENOUS | Status: DC | PRN
  Administered 2022-10-14: 20 mg via INTRAVENOUS
  Administered 2022-10-14: 110 mg via INTRAVENOUS

## 2022-10-14 MED ORDER — KETOROLAC TROMETHAMINE 30 MG/ML IJ SOLN
30.0000 mg | Freq: Once | INTRAMUSCULAR | Status: AC
  Administered 2022-10-14: 30 mg via INTRAVENOUS

## 2022-10-14 MED ORDER — THROMBIN 5000 UNITS EX SOLR
OROMUCOSAL | Status: DC | PRN
  Administered 2022-10-14: 5 mL via TOPICAL

## 2022-10-14 MED ORDER — DEXAMETHASONE SODIUM PHOSPHATE 10 MG/ML IJ SOLN
INTRAMUSCULAR | Status: AC
  Filled 2022-10-14: qty 1

## 2022-10-14 MED ORDER — CEFAZOLIN SODIUM-DEXTROSE 2-4 GM/100ML-% IV SOLN
2.0000 g | INTRAVENOUS | Status: AC
  Administered 2022-10-14: 2 g via INTRAVENOUS

## 2022-10-14 MED ORDER — HYDROCODONE-ACETAMINOPHEN 10-325 MG PO TABS: 1.0000 | ORAL_TABLET | ORAL | 0 refills | Status: DC | PRN

## 2022-10-14 MED ORDER — AMISULPRIDE (ANTIEMETIC) 5 MG/2ML IV SOLN: 10.0000 mg | Freq: Once | INTRAVENOUS | Status: DC | PRN

## 2022-10-14 MED ORDER — PHENOL 1.4 % MT LIQD: 1.0000 | OROMUCOSAL | Status: DC | PRN

## 2022-10-14 MED ORDER — MELATONIN 5 MG PO TABS: 5.0000 mg | ORAL_TABLET | Freq: Every evening | ORAL | Status: DC | PRN

## 2022-10-14 MED ORDER — HEMOSTATIC AGENTS (NO CHARGE) OPTIME
TOPICAL | Status: DC | PRN
  Administered 2022-10-14: 1 via TOPICAL

## 2022-10-14 MED ORDER — ONDANSETRON HCL 4 MG/2ML IJ SOLN
INTRAMUSCULAR | Status: DC | PRN
  Administered 2022-10-14: 4 mg via INTRAVENOUS

## 2022-10-14 MED ORDER — MELOXICAM 7.5 MG PO TABS: 7.5000 mg | ORAL_TABLET | Freq: Two times a day (BID) | ORAL | 1 refills | Status: DC | PRN

## 2022-10-14 MED ORDER — BUDESON-GLYCOPYRROL-FORMOTEROL 160-9-4.8 MCG/ACT IN AERO: 2.0000 | INHALATION_SPRAY | Freq: Two times a day (BID) | RESPIRATORY_TRACT | Status: DC

## 2022-10-14 MED ORDER — GABAPENTIN 300 MG PO CAPS
300.0000 mg | ORAL_CAPSULE | Freq: Three times a day (TID) | ORAL | Status: DC
  Administered 2022-10-14 (×2): 300 mg via ORAL
  Filled 2022-10-14 (×2): qty 1

## 2022-10-14 MED ORDER — ROCURONIUM BROMIDE 10 MG/ML (PF) SYRINGE
PREFILLED_SYRINGE | INTRAVENOUS | Status: AC
  Filled 2022-10-14: qty 10

## 2022-10-14 MED ORDER — CEFAZOLIN SODIUM-DEXTROSE 1-4 GM/50ML-% IV SOLN: 1.0000 g | Freq: Three times a day (TID) | INTRAVENOUS | Status: DC

## 2022-10-14 MED ORDER — DEXAMETHASONE 4 MG PO TABS
4.0000 mg | ORAL_TABLET | Freq: Four times a day (QID) | ORAL | Status: DC
  Administered 2022-10-14: 4 mg via ORAL
  Filled 2022-10-14: qty 1

## 2022-10-14 MED ORDER — SODIUM CHLORIDE 0.9% FLUSH
3.0000 mL | Freq: Two times a day (BID) | INTRAVENOUS | Status: DC
  Administered 2022-10-14 (×2): 3 mL via INTRAVENOUS

## 2022-10-14 MED ORDER — LIDOCAINE 2% (20 MG/ML) 5 ML SYRINGE
INTRAMUSCULAR | Status: AC
  Filled 2022-10-14: qty 5

## 2022-10-14 MED ORDER — FENTANYL CITRATE (PF) 250 MCG/5ML IJ SOLN
INTRAMUSCULAR | Status: DC | PRN
  Administered 2022-10-14 (×3): 50 ug via INTRAVENOUS

## 2022-10-14 MED ORDER — CHLORHEXIDINE GLUCONATE 0.12 % MT SOLN
OROMUCOSAL | Status: AC
  Administered 2022-10-14: 15 mL via OROMUCOSAL
  Filled 2022-10-14: qty 15

## 2022-10-14 MED ORDER — FLUTICASONE FUROATE-VILANTEROL 100-25 MCG/ACT IN AEPB
1.0000 | INHALATION_SPRAY | Freq: Every day | RESPIRATORY_TRACT | Status: DC
  Filled 2022-10-14: qty 28

## 2022-10-14 MED ORDER — ALBUTEROL SULFATE (2.5 MG/3ML) 0.083% IN NEBU: 2.5000 mg | INHALATION_SOLUTION | Freq: Four times a day (QID) | RESPIRATORY_TRACT | Status: DC | PRN

## 2022-10-14 MED ORDER — DEXAMETHASONE SODIUM PHOSPHATE 4 MG/ML IJ SOLN
4.0000 mg | Freq: Four times a day (QID) | INTRAMUSCULAR | Status: DC
  Administered 2022-10-14 – 2022-10-15 (×3): 4 mg via INTRAVENOUS
  Filled 2022-10-14 (×3): qty 1

## 2022-10-14 MED ORDER — SODIUM CHLORIDE 0.9 % IV SOLN
250.0000 mL | INTRAVENOUS | Status: DC
  Administered 2022-10-14: 250 mL via INTRAVENOUS

## 2022-10-14 MED ORDER — KETAMINE HCL 50 MG/5ML IJ SOSY
PREFILLED_SYRINGE | INTRAMUSCULAR | Status: AC
  Filled 2022-10-14: qty 5

## 2022-10-14 MED ORDER — 0.9 % SODIUM CHLORIDE (POUR BTL) OPTIME
TOPICAL | Status: DC | PRN
  Administered 2022-10-14: 1000 mL

## 2022-10-14 MED ORDER — LACTATED RINGERS IV SOLN: INTRAVENOUS | Status: DC

## 2022-10-14 MED ORDER — AMPHETAMINE-DEXTROAMPHETAMINE 10 MG PO TABS: 20.0000 mg | ORAL_TABLET | Freq: Every day | ORAL | Status: DC | PRN

## 2022-10-14 MED ORDER — LIDOCAINE 2% (20 MG/ML) 5 ML SYRINGE
INTRAMUSCULAR | Status: DC | PRN
  Administered 2022-10-14: 60 mg via INTRAVENOUS

## 2022-10-14 MED ORDER — CHLORHEXIDINE GLUCONATE CLOTH 2 % EX PADS: 6.0000 | MEDICATED_PAD | Freq: Once | CUTANEOUS | Status: DC

## 2022-10-14 MED ORDER — CEFAZOLIN SODIUM-DEXTROSE 2-4 GM/100ML-% IV SOLN
INTRAVENOUS | Status: AC
  Filled 2022-10-14: qty 100

## 2022-10-14 MED ORDER — HYDROMORPHONE HCL 1 MG/ML IJ SOLN
0.2500 mg | INTRAMUSCULAR | Status: DC | PRN
  Administered 2022-10-14 (×2): 0.5 mg via INTRAVENOUS

## 2022-10-14 MED ORDER — GABAPENTIN 300 MG PO CAPS: 300.0000 mg | ORAL_CAPSULE | Freq: Three times a day (TID) | ORAL | 1 refills | Status: DC

## 2022-10-14 MED ORDER — THROMBIN 5000 UNITS EX SOLR
CUTANEOUS | Status: AC
  Filled 2022-10-14: qty 10000

## 2022-10-14 MED ORDER — SENNA 8.6 MG PO TABS
1.0000 | ORAL_TABLET | Freq: Two times a day (BID) | ORAL | Status: DC
  Administered 2022-10-14: 8.6 mg via ORAL
  Filled 2022-10-14: qty 1

## 2022-10-14 MED ORDER — DEXAMETHASONE SODIUM PHOSPHATE 10 MG/ML IJ SOLN
INTRAMUSCULAR | Status: DC | PRN
  Administered 2022-10-14: 10 mg via INTRAVENOUS

## 2022-10-14 MED ORDER — HYDROCODONE-ACETAMINOPHEN 10-325 MG PO TABS
1.0000 | ORAL_TABLET | ORAL | Status: DC | PRN
  Administered 2022-10-14: 1 via ORAL
  Filled 2022-10-14 (×2): qty 1

## 2022-10-14 MED ORDER — HYDROCODONE-ACETAMINOPHEN 5-325 MG PO TABS
1.0000 | ORAL_TABLET | ORAL | Status: DC | PRN
  Administered 2022-10-14 – 2022-10-15 (×2): 1 via ORAL
  Filled 2022-10-14 (×2): qty 1

## 2022-10-14 MED ORDER — ONDANSETRON HCL 4 MG/2ML IJ SOLN
4.0000 mg | Freq: Four times a day (QID) | INTRAMUSCULAR | Status: DC | PRN
  Administered 2022-10-14: 4 mg via INTRAVENOUS
  Filled 2022-10-14: qty 2

## 2022-10-14 MED ORDER — SUGAMMADEX SODIUM 200 MG/2ML IV SOLN
INTRAVENOUS | Status: DC | PRN
  Administered 2022-10-14: 200 mg via INTRAVENOUS

## 2022-10-14 MED ORDER — GABAPENTIN 300 MG PO CAPS: 300.0000 mg | ORAL_CAPSULE | ORAL | Status: AC

## 2022-10-14 MED ORDER — SCOPOLAMINE 1 MG/3DAYS TD PT72
MEDICATED_PATCH | TRANSDERMAL | Status: AC
  Administered 2022-10-14: 1.5 mg via TRANSDERMAL
  Filled 2022-10-14: qty 1

## 2022-10-14 MED ORDER — PANTOPRAZOLE SODIUM 40 MG PO TBEC
40.0000 mg | DELAYED_RELEASE_TABLET | Freq: Every day | ORAL | Status: DC
  Administered 2022-10-14: 40 mg via ORAL
  Filled 2022-10-14: qty 1

## 2022-10-14 MED ORDER — ACETAMINOPHEN 500 MG PO TABS
ORAL_TABLET | ORAL | Status: AC
  Administered 2022-10-14: 1000 mg via ORAL
  Filled 2022-10-14: qty 2

## 2022-10-14 MED ORDER — HYDROMORPHONE HCL 1 MG/ML IJ SOLN: 0.5000 mg | INTRAMUSCULAR | Status: DC | PRN

## 2022-10-14 MED ORDER — UMECLIDINIUM BROMIDE 62.5 MCG/ACT IN AEPB
1.0000 | INHALATION_SPRAY | Freq: Every day | RESPIRATORY_TRACT | Status: DC
  Filled 2022-10-14: qty 7

## 2022-10-14 MED ORDER — KETAMINE HCL-SODIUM CHLORIDE 100-0.9 MG/10ML-% IV SOSY
PREFILLED_SYRINGE | INTRAVENOUS | Status: DC | PRN
  Administered 2022-10-14: 30 mg via INTRAVENOUS

## 2022-10-14 MED ORDER — HYDROMORPHONE HCL 1 MG/ML IJ SOLN
INTRAMUSCULAR | Status: AC
  Filled 2022-10-14: qty 1

## 2022-10-14 MED ORDER — SODIUM CHLORIDE 0.9% FLUSH: 3.0000 mL | INTRAVENOUS | Status: DC | PRN

## 2022-10-14 MED ORDER — CHLORHEXIDINE GLUCONATE 0.12 % MT SOLN: 15.0000 mL | Freq: Once | OROMUCOSAL | Status: AC

## 2022-10-14 MED ORDER — ROCURONIUM BROMIDE 10 MG/ML (PF) SYRINGE
PREFILLED_SYRINGE | INTRAVENOUS | Status: DC | PRN
  Administered 2022-10-14: 80 mg via INTRAVENOUS

## 2022-10-14 MED ORDER — MIDAZOLAM HCL 2 MG/2ML IJ SOLN
INTRAMUSCULAR | Status: AC
  Filled 2022-10-14: qty 2

## 2022-10-14 MED ORDER — POTASSIUM CHLORIDE IN NACL 20-0.9 MEQ/L-% IV SOLN: INTRAVENOUS | Status: DC

## 2022-10-14 MED ORDER — KETOROLAC TROMETHAMINE 30 MG/ML IJ SOLN
INTRAMUSCULAR | Status: AC
  Filled 2022-10-14: qty 1

## 2022-10-14 MED ORDER — BUPIVACAINE HCL (PF) 0.25 % IJ SOLN
INTRAMUSCULAR | Status: DC | PRN
  Administered 2022-10-14: 10 mL
  Administered 2022-10-14: 3 mL

## 2022-10-14 MED ORDER — ORAL CARE MOUTH RINSE: 15.0000 mL | Freq: Once | OROMUCOSAL | Status: AC

## 2022-10-14 MED ORDER — GABAPENTIN 300 MG PO CAPS
ORAL_CAPSULE | ORAL | Status: AC
  Administered 2022-10-14: 300 mg via ORAL
  Filled 2022-10-14: qty 1

## 2022-10-14 MED ORDER — ONDANSETRON HCL 4 MG PO TABS: 4.0000 mg | ORAL_TABLET | Freq: Four times a day (QID) | ORAL | Status: DC | PRN

## 2022-10-14 SURGICAL SUPPLY — 49 items
ADH SKN CLS APL DERMABOND .7 (GAUZE/BANDAGES/DRESSINGS) ×1
APL SKNCLS STERI-STRIP NONHPOA (GAUZE/BANDAGES/DRESSINGS) ×1
BAG COUNTER SPONGE SURGICOUNT (BAG) ×1 IMPLANT
BAG SPNG CNTER NS LX DISP (BAG) ×2
BAND INSRT 18 STRL LF DISP RB (MISCELLANEOUS) ×2
BAND RUBBER #18 3X1/16 STRL (MISCELLANEOUS) ×2 IMPLANT
BENZOIN TINCTURE PRP APPL 2/3 (GAUZE/BANDAGES/DRESSINGS) ×1 IMPLANT
BUR CARBIDE MATCH 3.0 (BURR) ×1 IMPLANT
CANISTER SUCT 3000ML PPV (MISCELLANEOUS) ×1 IMPLANT
DERMABOND ADVANCED .7 DNX12 (GAUZE/BANDAGES/DRESSINGS) IMPLANT
DRAPE HALF SHEET 40X57 (DRAPES) IMPLANT
DRAPE LAPAROTOMY 100X72X124 (DRAPES) ×1 IMPLANT
DRAPE MICROSCOPE SLANT 54X150 (MISCELLANEOUS) ×1 IMPLANT
DRAPE SURG 17X23 STRL (DRAPES) ×1 IMPLANT
DRSG OPSITE 4X5.5 SM (GAUZE/BANDAGES/DRESSINGS) IMPLANT
DURAPREP 26ML APPLICATOR (WOUND CARE) ×1 IMPLANT
ELECT REM PT RETURN 9FT ADLT (ELECTROSURGICAL) ×1
ELECTRODE REM PT RTRN 9FT ADLT (ELECTROSURGICAL) ×1 IMPLANT
GAUZE 4X4 16PLY ~~LOC~~+RFID DBL (SPONGE) IMPLANT
GLOVE BIO SURGEON STRL SZ7 (GLOVE) IMPLANT
GLOVE BIO SURGEON STRL SZ8 (GLOVE) ×1 IMPLANT
GLOVE BIOGEL PI IND STRL 7.0 (GLOVE) IMPLANT
GOWN STRL REUS W/ TWL LRG LVL3 (GOWN DISPOSABLE) IMPLANT
GOWN STRL REUS W/ TWL XL LVL3 (GOWN DISPOSABLE) ×1 IMPLANT
GOWN STRL REUS W/TWL 2XL LVL3 (GOWN DISPOSABLE) IMPLANT
GOWN STRL REUS W/TWL LRG LVL3 (GOWN DISPOSABLE)
GOWN STRL REUS W/TWL XL LVL3 (GOWN DISPOSABLE) ×1
HEMOSTAT POWDER KIT SURGIFOAM (HEMOSTASIS) ×1 IMPLANT
KIT BASIN OR (CUSTOM PROCEDURE TRAY) ×1 IMPLANT
KIT TURNOVER KIT B (KITS) ×1 IMPLANT
NDL HYPO 25X1 1.5 SAFETY (NEEDLE) ×1 IMPLANT
NDL SPNL 18GX3.5 QUINCKE PK (NEEDLE) IMPLANT
NDL SPNL 20GX3.5 QUINCKE YW (NEEDLE) IMPLANT
NEEDLE HYPO 25X1 1.5 SAFETY (NEEDLE) ×1 IMPLANT
NEEDLE SPNL 18GX3.5 QUINCKE PK (NEEDLE) ×1 IMPLANT
NEEDLE SPNL 20GX3.5 QUINCKE YW (NEEDLE) IMPLANT
NS IRRIG 1000ML POUR BTL (IV SOLUTION) ×1 IMPLANT
PACK LAMINECTOMY NEURO (CUSTOM PROCEDURE TRAY) ×1 IMPLANT
PAD ARMBOARD 7.5X6 YLW CONV (MISCELLANEOUS) ×3 IMPLANT
SEALANT ADHERUS EXTEND TIP (MISCELLANEOUS) IMPLANT
SPONGE SURGIFOAM ABS GEL SZ50 (HEMOSTASIS) IMPLANT
STRIP CLOSURE SKIN 1/2X4 (GAUZE/BANDAGES/DRESSINGS) ×1 IMPLANT
SUT VIC AB 0 CT1 18XCR BRD8 (SUTURE) ×1 IMPLANT
SUT VIC AB 0 CT1 8-18 (SUTURE) ×1
SUT VIC AB 2-0 CP2 18 (SUTURE) ×1 IMPLANT
SUT VIC AB 3-0 SH 8-18 (SUTURE) ×1 IMPLANT
TOWEL GREEN STERILE (TOWEL DISPOSABLE) ×1 IMPLANT
TOWEL GREEN STERILE FF (TOWEL DISPOSABLE) ×1 IMPLANT
WATER STERILE IRR 1000ML POUR (IV SOLUTION) ×1 IMPLANT

## 2022-10-14 NOTE — H&P (Signed)
Subjective: Patient is a 51 y.o. female admitted for L leg pain. Onset of symptoms was several months ago, gradually worsening since that time.  The pain is rated severe, and is located at the across the lower back and radiates to L leg. The pain is described as aching and occurs all day. The symptoms have been progressive. Symptoms are exacerbated by exercise and standing. 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 Authorizing Provider  albuterol (VENTOLIN HFA) 108 (90 Base) MCG/ACT inhaler Inhale 2 puffs into the lungs every 6 (six) hours as needed for wheezing or shortness of breath.   Yes [provider]  amoxicillin-clavulanate (AUGMENTIN) 875-125 MG tablet Take 1 tablet by mouth 2 (two) times daily. 10/07/22  Yes [provider]  Aspirin-Caffeine (BC FAST PAIN RELIEF PO) Take 1-2 packets by mouth 3 (three) times daily as needed (pain).   Yes [provider]  Budeson-Glycopyrrol-Formoterol (BREZTRI AEROSPHERE) 160-9-4.8 MCG/ACT AERO Inhale 2 puffs into the lungs 2 (two) times daily.   Yes [provider]  calcium carbonate (TUMS EX) 750 MG chewable tablet Chew 2-3 tablets by mouth 3 (three) times daily as needed for heartburn.   Yes [provider]  doxycycline (VIBRA-TABS) 100 MG tablet Take 100 mg by mouth daily as needed (acne). 05/10/22  Yes [provider]  HYDROcodone-acetaminophen (NORCO) 10-325 MG tablet Take 1 tablet by mouth every 4 (four) hours as needed for moderate  pain.   Yes [provider]  Melatonin 5 MG CHEW Chew 5-10 mg by mouth at bedtime as needed (sleep).   Yes [provider]  meloxicam (MOBIC) 7.5 MG tablet Take 7.5 mg by mouth 2 (two) times daily as needed for pain. 09/26/22  Yes [provider]  omeprazole (PRILOSEC) 40 MG capsule Take 40 mg by mouth daily.   Yes [provider]  amphetamine-dextroamphetamine (ADDERALL) 20 MG tablet Take 20 mg by mouth daily as needed (focusing).    [provider]   Allergies  Allergen Reactions   Sulfa Antibiotics     Unknown reaction    Tramadol     Unknown reaction    Z-Pak [Azithromycin]     Unknown reaction     Social History   Tobacco Use   Smoking status: Heavy Smoker    Packs/day: 2.00    Types: Cigarettes   Smokeless tobacco: Never  Substance Use Topics   Alcohol use: Not Currently    Family History  Problem Relation Age of Onset   Stroke Father    Heart disease Father    Cancer Father      Review of Systems  Positive ROS: neg  All other systems have been reviewed and were otherwise negative with the exception of those mentioned in the HPI and as above.  Objective: Vital signs in last 24 hours: Temp:  [97.8 F (36.6 C)] 97.8 F (36.6 C) (10/16 0545) Pulse Rate:  [  75] 75 (10/16 0545) Resp:  [17] 17 (10/16 0545) BP: (122)/(62) 122/62 (10/16 0545) SpO2:  [95 %] 95 % (10/16 0545) Weight:  [104.3 kg] 104.3 kg (10/16 0545)  General Appearance: Alert, cooperative, no distress, appears stated age Head: Normocephalic, without obvious abnormality, atraumatic Eyes: PERRL, conjunctiva/corneas clear, EOM's intact    Neck: Supple, symmetrical, trachea midline Back: Symmetric, no curvature, ROM normal, no CVA tenderness Lungs:  respirations unlabored Heart: Regular rate and rhythm Abdomen: Soft, non-tender Extremities: Extremities normal, atraumatic, no cyanosis or edema Pulses: 2+ and symmetric all extremities Skin: Skin color,  texture, turgor normal, no rashes or lesions  NEUROLOGIC:   Mental status: Alert and oriented x4,  no aphasia, good attention span, fund of knowledge, and memory Motor Exam - grossly normal Sensory Exam - grossly normal Reflexes: 1= Coordination - grossly normal Gait - grossly normal Balance - grossly normal Cranial Nerves: I: smell Not tested  II: visual acuity  OS: nl    OD: nl  II: visual fields Full to confrontation  II: pupils Equal, round, reactive to light  III,VII: ptosis None  III,IV,VI: extraocular muscles  Full ROM  V: mastication Normal  V: facial light touch sensation  Normal  V,VII: corneal reflex  Present  VII: facial muscle function - upper  Normal  VII: facial muscle function - lower Normal  VIII: hearing Not tested  IX: soft palate elevation  Normal  IX,X: gag reflex Present  XI: trapezius strength  5/5  XI: sternocleidomastoid strength 5/5  XI: neck flexion strength  5/5  XII: tongue strength  Normal    Data Review Lab Results  Component Value Date   WBC 15.0 (H) 10/10/2022   HGB 15.2 (H) 10/10/2022   HCT 46.7 (H) 10/10/2022   MCV 89.8 10/10/2022   PLT 526 (H) 10/10/2022   No results found for: "NA", "K", "CL", "CO2", "BUN", "CREATININE", "GLUCOSE" Lab Results  Component Value Date   INR 1.0 10/10/2022    Assessment/Plan:  Estimated body mass index is 37.12 kg/m as calculated from the following:   Height as of this encounter: '5\' 6"'$  (1.676 m).   Weight as of this encounter: 104.3 kg. Patient admitted for re-do L L5-S1 microdiskectomy. Patient has failed a reasonable attempt at conservative therapy.  I explained the condition and procedure to the patient and answered any questions.  Patient wishes to proceed with procedure as planned. Understands risks/ benefits and typical outcomes of procedure.   Eustace Moore 10/14/2022 7:27 AM

## 2022-10-14 NOTE — Discharge Summary (Addendum)
Physician Discharge Summary  Patient ID: Amber Conrad MRN: [id number] DOB/AGE: 1971/03/19 51 y.o.  Admit date: 10/14/2022 Discharge date: 10/15/2022  Admission Diagnoses: recurrent HNP L5-S1 left   Discharge Diagnoses: same   Discharged Condition: good  Hospital Course: The patient was admitted on 10/14/2022 and taken to the operating room where the patient underwent re-do L L5-S1 microdiskectomy. The patient tolerated the procedure well and was taken to the recovery room and then to the floor in stable condition. The hospital course was routine. There were no complications. The wound remained clean dry and intact. Pt had appropriate back soreness. No complaints of leg pain or new N/T/W. The patient remained afebrile with stable vital signs, and tolerated a regular diet. The patient continued to increase activities, and pain was well controlled with oral pain medications.   Consults: None  Significant Diagnostic Studies:  Results for orders placed or performed during the hospital encounter of 10/10/22  Surgical pcr screen   Specimen: Nasal Mucosa; Nasal Swab  Result Value Ref Range   MRSA, PCR NEGATIVE NEGATIVE   Staphylococcus aureus NEGATIVE NEGATIVE  CBC  Result Value Ref Range   WBC 15.0 (H) 4.0 - 10.5 K/uL   RBC 5.20 (H) 3.87 - 5.11 MIL/uL   Hemoglobin 15.2 (H) 12.0 - 15.0 g/dL   HCT 46.7 (H) 36.0 - 46.0 %   MCV 89.8 80.0 - 100.0 fL   MCH 29.2 26.0 - 34.0 pg   MCHC 32.5 30.0 - 36.0 g/dL   RDW 13.8 11.5 - 15.5 %   Platelets 526 (H) 150 - 400 K/uL   nRBC 0.0 0.0 - 0.2 %  Protime-INR  Result Value Ref Range   Prothrombin Time 12.7 11.4 - 15.2 seconds   INR 1.0 0.8 - 1.2    DG Lumbar Spine 2-3 Views  Result Date: 10/14/2022 CLINICAL DATA:  Intraop imaging for localization EXAM: LUMBAR SPINE - 1 VIEW COMPARISON:  None Available. FINDINGS: No acute osseous abnormality. Normal alignment. Film #1 with localization needle posterior to S1. Film #2 with localization needle  posterior to the L5-S1 disc space. Film #3 with localization needles posterior to L5 and overlying the posterior cortex of S1. IMPRESSION: Intraop imaging of the lumbar spine as above. Electronically Signed   By: Yetta Glassman M.D.   On: 10/14/2022 10:51   MR Lumbar Spine W Wo Contrast  Result Date: 09/20/2022 CLINICAL DATA:  Back pain for 3 years EXAM: MRI LUMBAR SPINE WITHOUT AND WITH CONTRAST TECHNIQUE: Multiplanar and multiecho pulse sequences of the lumbar spine were obtained without and with intravenous contrast. CONTRAST:  18m MULTIHANCE GADOBENATE DIMEGLUMINE 529 MG/ML IV SOLN COMPARISON:  06/18/2019 FINDINGS: Segmentation:  Standard. Alignment:  Physiologic. Vertebrae:  No fracture, evidence of discitis, or bone lesion. Conus medullaris and cauda equina: Conus extends to the T12-L1 level. Conus and cauda equina appear normal. Paraspinal and other soft tissues: Negative. Disc levels: T12-L1: Shallow left paracentral disc bulge. Unremarkable facet joints. No foraminal or canal stenosis. Unchanged. L1-L2: Unremarkable. L2-L3: Unremarkable disc. Mild bilateral facet arthropathy. No foraminal or canal stenosis. Unchanged. L3-L4: Annular disc bulge, eccentric to the left. Small posterior annular fissure centrally. Mild-moderate bilateral facet arthropathy. No foraminal or canal stenosis. Unchanged. L4-L5: No disc protrusion. Annular fissure in the right foraminal zone. Mild-to-moderate bilateral facet arthropathy. Borderline-mild right foraminal stenosis. No canal stenosis. Slight interval progression. L5-S1: Postsurgical changes to the left lamina. Disc bulge with focal extrusion in the left subarticular zone impinging the descending left S1 nerve root.  Mild-moderate bilateral facet arthropathy. No canal stenosis. Mild-moderate right foraminal stenosis. Findings progressed from prior. IMPRESSION: 1. Focal extrusion in the left subarticular zone at L5-S1 impinging the descending left S1 nerve root. 2.  Mild-to-moderate right foraminal stenosis at L5-S1 and borderline-mild right foraminal stenosis at L4-L5, slightly progressed from prior. 3. No canal stenosis at any level. Electronically Signed   By: Davina Poke D.O.   On: 09/20/2022 17:03    Antibiotics:  Anti-infectives (From admission, onward)    Start     Dose/Rate Route Frequency Ordered Stop   10/14/22 1530  ceFAZolin (ANCEF) IVPB 1 g/50 mL premix        1 g 100 mL/hr over 30 Minutes Intravenous Every 8 hours 10/14/22 1125 10/14/22 2329   10/14/22 1030  ceFAZolin (ANCEF) IVPB 1 g/50 mL premix  Status:  Discontinued        1 g 100 mL/hr over 30 Minutes Intravenous Every 8 hours 10/14/22 1023 10/14/22 1125   10/14/22 0615  ceFAZolin (ANCEF) IVPB 2g/100 mL premix        2 g 200 mL/hr over 30 Minutes Intravenous On call to O.R. 10/14/22 0601 10/14/22 0819   10/14/22 0607  ceFAZolin (ANCEF) 2-4 GM/100ML-% IVPB       Note to Pharmacy: Leonides Sake: cabinet override      10/14/22 0607 10/14/22 0749       Discharge Exam: Blood pressure 122/68, pulse 63, temperature 97.7 F (36.5 C), temperature source Oral, resp. rate 20, height '5\' 6"'$  (1.676 m), weight 104.3 kg, SpO2 94 %. Neurologic: Grossly normal Dressing dry  Discharge Medications:   Allergies as of 10/14/2022       Reactions   Sulfa Antibiotics    Unknown reaction    Tramadol    Unknown reaction    Z-pak [azithromycin]    Unknown reaction         Medication List     STOP taking these medications    amoxicillin-clavulanate 875-125 MG tablet Commonly known as: AUGMENTIN   doxycycline 100 MG tablet Commonly known as: VIBRA-TABS       TAKE these medications    albuterol 108 (90 Base) MCG/ACT inhaler Commonly known as: VENTOLIN HFA Inhale 2 puffs into the lungs every 6 (six) hours as needed for wheezing or shortness of breath.   amphetamine-dextroamphetamine 20 MG tablet Commonly known as: ADDERALL Take 20 mg by mouth daily as needed (focusing).    BC FAST PAIN RELIEF PO Take 1-2 packets by mouth 3 (three) times daily as needed (pain).   Breztri Aerosphere 160-9-4.8 MCG/ACT Aero Generic drug: Budeson-Glycopyrrol-Formoterol Inhale 2 puffs into the lungs 2 (two) times daily.   calcium carbonate 750 MG chewable tablet Commonly known as: TUMS EX Chew 2-3 tablets by mouth 3 (three) times daily as needed for heartburn.   gabapentin 300 MG capsule Commonly known as: NEURONTIN Take 1 capsule (300 mg total) by mouth 3 (three) times daily.   HYDROcodone-acetaminophen 10-325 MG tablet Commonly known as: NORCO Take 1 tablet by mouth every 4 (four) hours as needed for moderate pain.   Melatonin 5 MG Chew Chew 5-10 mg by mouth at bedtime as needed (sleep).   meloxicam 7.5 MG tablet Commonly known as: MOBIC Take 1 tablet (7.5 mg total) by mouth 2 (two) times daily as needed for pain.   omeprazole 40 MG capsule Commonly known as: PRILOSEC Take 40 mg by mouth daily.        Disposition: home   Final Dx: re-do  L L5-S1 microdiskectomy  Discharge Instructions      Remove dressing in 72 hours   Complete by: As directed    Call MD for:  difficulty breathing, headache or visual disturbances   Complete by: As directed    Call MD for:  persistant nausea and vomiting   Complete by: As directed    Call MD for:  redness, tenderness, or signs of infection (pain, swelling, redness, odor or green/yellow discharge around incision site)   Complete by: As directed    Call MD for:  severe uncontrolled pain   Complete by: As directed    Call MD for:  temperature >100.4   Complete by: As directed    Diet - low sodium heart healthy   Complete by: As directed    Increase activity slowly   Complete by: As directed         Follow-up Information     Eustace Moore, MD. Schedule an appointment as soon as possible for a visit in 2 week(s).   Specialty: Neurosurgery Contact information: [address] [place]  [address] [phone]                  Signed: Eustace Moore 10/14/2022, 2:51 PM

## 2022-10-14 NOTE — Evaluation (Signed)
Occupational Therapy Evaluation Patient Details Name: Amber Conrad MRN: [id number] DOB: 09/08/1971 Today's Date: 10/14/2022   History of Present Illness This 51 yo female s/p redo left L5-S1 hemilaminectomy medial facetectomy foraminotomies followed by microdiscectomy utilizing microscopic dissection due to left leg pain with MRI or CT showed recurrent HNP L5-S1 left.   Clinical Impression   This 51 yo female admitted and underwent above presents to acute OT with PLOF of being able to do her own basic ADLs and IADLs. Currently due to back precautions, pain, and stating that her left lower leg just does not feel right (pressure) she needs A for LBADLs and mobility. She will benefit from one more session of acute OT prior to D/C.     Recommendations for follow up therapy are one component of a multi-disciplinary discharge planning process, led by the attending physician.  Recommendations may be updated based on patient status, additional functional criteria and insurance authorization.   Follow Up Recommendations  No OT follow up    Assistance Recommended at Discharge Intermittent Supervision/Assistance  Patient can return home with the following A little help with walking and/or transfers;A little help with bathing/dressing/bathroom;Assistance with cooking/housework;Assist for transportation;Help with stairs or ramp for entrance    Functional Status Assessment  Patient has had a recent decline in their functional status and demonstrates the ability to make significant improvements in function in a reasonable and predictable amount of time.  Equipment Recommendations  None recommended by OT       Precautions / Restrictions Precautions Precautions: Fall;Back Precaution Booklet Issued: Yes (comment) Restrictions Weight Bearing Restrictions: No      Mobility Bed Mobility Overal bed mobility: Modified Independent                  Transfers Overall transfer level: Needs  assistance Equipment used: Rolling walker (2 wheels) Transfers: Sit to/from Stand Sit to Stand: Min guard                  Balance Overall balance assessment: Needs assistance Sitting-balance support: No upper extremity supported, Feet supported Sitting balance-Leahy Scale: Good     Standing balance support: Bilateral upper extremity supported, Reliant on assistive device for balance Standing balance-Leahy Scale: Poor Standing balance comment: not trusting her LLE because it does not feel right                           ADL either performed or assessed with clinical judgement   ADL Overall ADL's : Needs assistance/impaired Eating/Feeding: Independent;Sitting   Grooming: Set up;Sitting Grooming Details (indicate cue type and reason): Educated on use of 2 cups for brushing teeth to avoid bending over the sink Upper Body Bathing: Set up;Sitting   Lower Body Bathing: Minimal assistance Lower Body Bathing Details (indicate cue type and reason): for lower legs, min guard A sit<>stand Upper Body Dressing : Set up;Sitting   Lower Body Dressing: Set up Lower Body Dressing Details (indicate cue type and reason): for gown and slip on shoes; min guard A sit<>stand--if needs to wear underwear,pants, socks she will get family to A Toilet Transfer: Min guard;Ambulation;Rolling walker (2 wheels) Toilet Transfer Details (indicate cue type and reason): simulated bed>out into hallway with RW>bed Toileting- Clothing Manipulation and Hygiene: Min guard;Sit to/from stand Toileting - Clothing Manipulation Details (indicate cue type and reason): Educated on use of wet wipes for back peri care   Tub/Shower Transfer Details (indicate cue type and reason): educated on  side step into tub for showering   General ADL Comments: Educated on not sitting for more than 20-30 minutes at a time     Vision Patient Visual Report: No change from baseline              Pertinent Vitals/Pain  Pain Assessment Pain Assessment: Faces Faces Pain Scale: Hurts even more Pain Location: incisonal and LLE Pain Descriptors / Indicators: Aching, Sore, Pressure Pain Intervention(s): Limited activity within patient's tolerance, Monitored during session, Repositioned, Premedicated before session     Hand Dominance Right   Extremity/Trunk Assessment Upper Extremity Assessment Upper Extremity Assessment: Overall WFL for tasks assessed           Communication Communication Communication: No difficulties   Cognition Arousal/Alertness: Awake/alert Behavior During Therapy: WFL for tasks assessed/performed Overall Cognitive Status: Within Functional Limits for tasks assessed                                                  Home Living Family/patient expects to be discharged to:: Private residence Living Arrangements: Spouse/significant other Available Help at Discharge: Family;Available PRN/intermittently Type of Home: House Home Access: Stairs to enter [employer] of Steps: 1 Entrance Stairs-Rails: None Home Layout: One level     Bathroom Shower/Tub: Tub/shower unit;Curtain   Biochemist, clinical: Standard     Home Equipment: Conservation officer, nature (2 wheels)          Prior Functioning/Environment Prior Level of Function : Independent/Modified Independent                        OT Problem List: Decreased strength;Decreased range of motion;Impaired balance (sitting and/or standing);Pain      OT Treatment/Interventions: Self-care/ADL training;DME and/or AE instruction;Patient/family education;Balance training    OT Goals(Current goals can be found in the care plan section) Acute Rehab OT Goals Patient Stated Goal: for my left lower leg to feel right OT Goal Formulation: With patient Time For Goal Achievement: 10/28/22 Potential to Achieve Goals: Good  OT Frequency: Min 2X/week       AM-PAC OT "6 Clicks" Daily Activity     Outcome  Measure Help from another person eating meals?: None Help from another person taking care of personal grooming?: A Little Help from another person toileting, which includes using toliet, bedpan, or urinal?: A Little Help from another person bathing (including washing, rinsing, drying)?: A Lot Help from another person to put on and taking off regular upper body clothing?: A Little Help from another person to put on and taking off regular lower body clothing?: A Lot 6 Click Score: 17   End of Session Equipment Utilized During Treatment: Gait belt;Rolling walker (2 wheels) Nurse Communication: Mobility status  Activity Tolerance: Patient tolerated treatment well Patient left: in bed;with call bell/phone within reach  OT Visit Diagnosis: Unsteadiness on feet (R26.81);Other abnormalities of gait and mobility (R26.89);Muscle weakness (generalized) (M62.81);Pain Pain - part of body:  (incisional and LLE)                Time: 0947-0962 OT Time Calculation (min): 30 min Charges:  OT General Charges $OT Visit: 1 Visit OT Evaluation $OT Eval Moderate Complexity: 1 Mod OT Treatments $Self Care/Home Management : 8-22 mins  [place], OTR/L Acute Rehab Services [facility] [phone] Office [phone]    Almon Register 10/14/2022, 4:41  PM

## 2022-10-14 NOTE — Progress Notes (Signed)
Patient ID: Amber Conrad, female   DOB: [birth year], 51 y.o.   MRN: [id number] Not unexpectedly, having some aching in the L leg with some numbness in the foot. Will start some neurontin and ask PT to see her, seems to be DF/PF ok to in bed exam

## 2022-10-14 NOTE — Transfer of Care (Signed)
Immediate Anesthesia Transfer of Care Note  Patient: Amber Conrad  Procedure(s) Performed: Microdiscectomy re-do  - left - Lumbar Five-Sacral One (Left: Back)  Patient Location: PACU  Anesthesia Type:General  Level of Consciousness: drowsy and patient cooperative  Airway & Oxygen Therapy: Patient Spontanous Breathing and Patient connected to face mask oxygen  Post-op Assessment: Report given to RN and Post -op Vital signs reviewed and stable  Post vital signs: Reviewed and stable  Last Vitals:  Vitals Value Taken Time  BP 146/62 10/14/22 0931  Temp    Pulse 70 10/14/22 0932  Resp 20 10/14/22 0933  SpO2 98 % 10/14/22 0932  Vitals shown include unvalidated device data.  Last Pain:  Vitals:   10/14/22 0630  TempSrc:   PainSc: 7          Complications: No notable events documented.

## 2022-10-14 NOTE — Op Note (Signed)
10/14/2022  9:22 AM  PATIENT:  Amber Conrad  51 y.o. 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 Imaging showed recurrent disc herniation L5-S1 left with compression of left S1 nerve root. The patient tried conservative measures without relief. Pain was debilitating. Recommended redo left L5-S1 microdiscectomy. Patient understood the risks, benefits, and alternatives and potential outcomes and wished to proceed.  PROCEDURE DETAILS: The patient was taken to the operating room and after induction of adequate generalized endotracheal anesthesia, the patient was rolled into the prone position on the Wilson frame and all pressure points were padded. The lumbar region was cleaned and then prepped with DuraPrep and draped in the usual sterile fashion. 5 cc of local anesthesia was injected and then a dorsal midline incision was made and carried down to the lumbo sacral fascia. The fascia was opened and the paraspinous musculature was taken down in a subperiosteal fashion to expose L5-S1 on the left. Intraoperative x-ray confirmed my level, and then I dissected between the remaining lamina and the epidural fibrosis at L5-S1 on the left.  I used a combination of the high-speed drill and the Kerrison punches to perform a redo hemilaminectomy, medial facetectomy, and foraminotomy at L5-S1 on the left. The underlying remaining yellow ligament was opened and removed in a piecemeal  fashion to expose the underlying dura and exiting nerve root. I undercut the lateral recess and dissected down until I was medial to and distal to the S1 pedicle. The nerve root was well decompressed. We then gently retracted the nerve root medially with a retractor, coagulated the epidural venous vasculature, and incised the disc space. I performed a thorough intradiscal discectomy with pituitary rongeurs and curettes, until I had a nice decompression of the nerve root and the midline.  We worked on both the axilla of the nerve root and lateral to the nerve root.  I then palpated with a coronary dilator along the nerve root and into the foramen to assure adequate decompression. I felt no more compression of the nerve root. I irrigated with saline solution containing bacitracin. Achieved hemostasis with bipolar cautery, lined the dura with Gelfoam, and then closed the fascia with 0 Vicryl. I closed the subcutaneous tissues with 2-0 Vicryl and the subcuticular tissues with 3-0 Vicryl. The skin was then closed with benzoin and Steri-Strips. The drapes were removed, a sterile dressing was applied.  My nurse practitioner was involved in the exposure, safe retraction of the neural elements, the disc work and the closure. the patient was awakened from general anesthesia and transferred to the recovery room in stable condition. At the end of the procedure all sponge, needle and instrument counts were correct.    PLAN OF CARE: Admit for overnight observation  PATIENT DISPOSITION:  PACU - hemodynamically stable.   Delay start of Pharmacological VTE agent (>24hrs) due to surgical blood loss or risk of bleeding:  yes

## 2022-10-14 NOTE — Anesthesia Procedure Notes (Signed)
Procedure Name: Intubation Date/Time: 10/14/2022 7:49 AM  Performed by: Genelle Bal, CRNAPre-anesthesia Checklist: Patient identified, Emergency Drugs available, Suction available and Patient being monitored Patient Re-evaluated:Patient Re-evaluated prior to induction Oxygen Delivery Method: Circle system utilized Preoxygenation: Pre-oxygenation with 100% oxygen Induction Type: IV induction Ventilation: Mask ventilation without difficulty Laryngoscope Size: Miller and 2 Grade View: Grade I Tube type: Oral Tube size: 7.0 mm Number of attempts: 1 Airway Equipment and Method: Stylet Placement Confirmation: ETT inserted through vocal cords under direct vision, positive ETCO2 and breath sounds checked- equal and bilateral Secured at: 21 cm Tube secured with: Tape Dental Injury: Teeth and Oropharynx as per pre-operative assessment

## 2022-10-14 NOTE — Progress Notes (Signed)
1015 attempted to call report to [facility], they will call back

## 2022-10-14 NOTE — Anesthesia Postprocedure Evaluation (Signed)
Anesthesia Post Note  Patient: Amber Conrad  Procedure(s) Performed: Microdiscectomy re-do  - left - Lumbar Five-Sacral One (Left: Back)     Patient location during evaluation: PACU Anesthesia Type: General Level of consciousness: awake and alert Pain management: pain level controlled Vital Signs Assessment: post-procedure vital signs reviewed and stable Respiratory status: spontaneous breathing, nonlabored ventilation and respiratory function stable Cardiovascular status: blood pressure returned to baseline Postop Assessment: no apparent nausea or vomiting Anesthetic complications: no   No notable events documented.  Last Vitals:  Vitals:   10/14/22 1015 10/14/22 1030  BP: (!) 144/65 (!) 153/77  Pulse: 76 73  Resp: 17 13  Temp:  36.9 C  SpO2: 94% 94%    Last Pain:  Vitals:   10/14/22 1030  TempSrc:   PainSc: [place]

## 2022-10-15 ENCOUNTER — Encounter (HOSPITAL_COMMUNITY): Payer: Self-pay | Admitting: Neurological Surgery

## 2022-10-15 DIAGNOSIS — M5117 Intervertebral disc disorders with radiculopathy, lumbosacral region: Secondary | ICD-10-CM | POA: Diagnosis not present

## 2022-10-15 NOTE — Progress Notes (Signed)
Patient alert and oriented, patient denies numbness, pt. Void, surgical site is dry and clean no sign of infection. D/c instruction explain and copy given to the patient. All questions answered.

## 2022-10-15 NOTE — Plan of Care (Signed)

## 2022-10-15 NOTE — Evaluation (Signed)
Physical Therapy Evaluation and Discharge Patient Details Name: Amber Conrad MRN: [id number] DOB: May 09, 1971 Today's Date: 10/15/2022  History of Present Illness  This 51 yo female s/p redo left L5-S1 hemilaminectomy medial facetectomy foraminotomies followed by microdiscectomy utilizing microscopic dissection due to left leg pain with MRI or CT showed recurrent HNP L5-S1 left.  Clinical Impression   Patient evaluated by Physical Therapy with no further acute PT needs identified. All education has been completed and the patient has no further questions.  PT is signing off. Thank you for this referral.        Recommendations for follow up therapy are one component of a multi-disciplinary discharge planning process, led by the attending physician.  Recommendations may be updated based on patient status, additional functional criteria and insurance authorization.  Follow Up Recommendations No PT follow up      Assistance Recommended at Discharge PRN  Patient can return home with the following  Help with stairs or ramp for entrance    Equipment Recommendations None recommended by PT  Recommendations for Other Services       Functional Status Assessment Patient has had a recent decline in their functional status and demonstrates the ability to make significant improvements in function in a reasonable and predictable amount of time.     Precautions / Restrictions Precautions Precautions: Fall;Back Precaution Booklet Issued:  (already issued by OT) Precaution Comments: pt able to name 2/3 precautions and 3rd with cues; no cues needed to maintain precautions Restrictions Weight Bearing Restrictions: No      Mobility  Bed Mobility Overal bed mobility: Modified Independent             General bed mobility comments: side to sit with rail    Transfers Overall transfer level: Needs assistance Equipment used: None Transfers: Sit to/from Stand Sit to Stand: Min guard            General transfer comment: no imbalance or need for assist; guarding for safety due to no RW    Ambulation/Gait Ambulation/Gait assistance: Min guard, Supervision Gait Distance (Feet): 150 Feet Assistive device: None Gait Pattern/deviations: Step-through pattern, Decreased stride length   Gait velocity interpretation: 1.31 - 2.62 ft/sec, indicative of limited community ambulator   General Gait Details: slowed pace with cautious steps due to pain and LLE tingling; no imbalance and no reaching for rail or walls  Stairs Stairs:  (pt educated to lead up with "better" RLE and descend with "worse" LLE)          Wheelchair Mobility    Modified Rankin (Stroke Patients Only)       Balance Overall balance assessment: Needs assistance Sitting-balance support: No upper extremity supported, Feet supported Sitting balance-Leahy Scale: Good     Standing balance support: No upper extremity supported Standing balance-Leahy Scale: Fair                               Pertinent Vitals/Pain Pain Assessment Pain Assessment: 0-10 Pain Score: 3  Pain Location: incisonal and LLE Pain Descriptors / Indicators: Aching, Sore, Pressure Pain Intervention(s): Limited activity within patient's tolerance, Premedicated before session    [place] expects to be discharged to:: Private residence Living Arrangements: Spouse/significant other Available Help at Discharge: Family;Available PRN/intermittently Type of Home: House Home Access: Stairs to enter Entrance Stairs-Rails: None Entrance Stairs-Number of Steps: 1   Home Layout: One level Home Equipment: Conservation officer, nature (2 wheels)  Prior Function Prior Level of Function : Independent/Modified Independent                     Hand Dominance   Dominant Hand: Right    Extremity/Trunk Assessment   Upper Extremity Assessment Upper Extremity Assessment: Overall WFL for tasks assessed    Lower  Extremity Assessment Lower Extremity Assessment: LLE deficits/detail LLE Deficits / Details: reports numbness and tingling but feels better than 10/16 post-op LLE Sensation: decreased light touch    Cervical / Trunk Assessment Cervical / Trunk Assessment: Back Surgery  Communication   Communication: No difficulties  Cognition Arousal/Alertness: Awake/alert Behavior During Therapy: WFL for tasks assessed/performed Overall Cognitive Status: Within Functional Limits for tasks assessed                                          General Comments      Exercises     Assessment/Plan    PT Assessment Patient does not need any further PT services  PT Problem List         PT Treatment Interventions      PT Goals (Current goals can be found in the Care Plan section)  Acute Rehab PT Goals Patient Stated Goal: go home today PT Goal Formulation: All assessment and education complete, DC therapy    Frequency       Co-evaluation               AM-PAC PT "6 Clicks" Mobility  Outcome Measure Help needed turning from your back to your side while in a flat bed without using bedrails?: None Help needed moving from lying on your back to sitting on the side of a flat bed without using bedrails?: None Help needed moving to and from a bed to a chair (including a wheelchair)?: A Little Help needed standing up from a chair using your arms (e.g., wheelchair or bedside chair)?: A Little Help needed to walk in hospital room?: A Little Help needed climbing 3-5 steps with a railing? : A Little 6 Click Score: 20    End of Session Equipment Utilized During Treatment: Gait belt Activity Tolerance: Patient tolerated treatment well Patient left: in bed;with call bell/phone within reach Nurse Communication: Mobility status;Other (comment) (Ok for dc; no PT needs) PT Visit Diagnosis: Unsteadiness on feet (R26.81);Pain Pain - Right/Left:  (midline) Pain - part of body:  (back)     Time: 9326-7124 PT Time Calculation (min) (ACUTE ONLY): 8 min   Charges:   PT Evaluation $PT Eval Low Complexity: [place], PT Acute Rehabilitation Services  Office [phone]   Rexanne Mano 10/15/2022, 8:45 AM

## 2022-10-15 NOTE — Progress Notes (Signed)
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   OT comments  Patient seated on EOB upon entry and interested in AE for LB dressing. Patient instructed on doffing socks with dressing stick and donning with sock aide with patient able to return demonstration with supervision and verbal cues.  Simulated donning pants with reacher. Patient instructed on hip kit and how to purchase with her and her daughter via telephone. Patient expected to discharge home.    Recommendations for follow up therapy are one component of a multi-disciplinary discharge planning process, led by the attending physician.  Recommendations may be updated based on patient status, additional functional criteria and insurance authorization.    Follow Up Recommendations  No OT follow up    Assistance Recommended at Discharge Intermittent Supervision/Assistance  Patient can return home with the following  A little help with walking and/or transfers;A little help with bathing/dressing/bathroom;Assistance with cooking/housework;Assist for transportation;Help with stairs or ramp for entrance   Equipment Recommendations  None recommended by OT    Recommendations for Other Services      Precautions / Restrictions Precautions Precautions: Fall;Back Precaution Comments: pt able to name 2/3 precautions and 3rd with cues; no cues needed to maintain precautions Restrictions Weight Bearing Restrictions: No       Mobility Bed Mobility Overal bed mobility: Modified Independent             General bed mobility comments: sitting on EOB upon entry    Transfers Overall transfer level: Needs  assistance Equipment used: None Transfers: Sit to/from Stand Sit to Stand: Min guard           General transfer comment: ambulated to from EOB to nursing station and back without assistive device     Balance Overall balance assessment: Needs assistance Sitting-balance support: No upper extremity supported, Feet supported Sitting balance-Leahy Scale: Good     Standing balance support: No upper extremity supported Standing balance-Leahy Scale: Fair Standing balance comment: able to stand without device or support                           ADL either performed or assessed with clinical judgement   ADL Overall ADL's : Needs assistance/impaired                     Lower Body Dressing: Supervision/safety;Set up;With adaptive equipment Lower Body Dressing Details (indicate cue type and reason): education use for AE use for LB dressing with reacher, sock aide, and dressing stick               General ADL Comments: instructions on AE and where to purchase    Extremity/Trunk Assessment Upper Extremity Assessment Upper Extremity Assessment: Overall WFL for tasks assessed            Vision       Perception     Praxis      Cognition Arousal/Alertness: Awake/alert Behavior During Therapy: WFL for tasks assessed/performed Overall Cognitive Status: Within Functional Limits for tasks assessed                                   General Comments: demonstrated good understanding of AE use        Exercises      Shoulder Instructions       General Comments      Pertinent Vitals/ Pain       Pain Assessment Pain Assessment: 0-10 Pain Score: 3  Pain Location: incisonal and LLE Pain Descriptors / Indicators: Aching, Sore, Pressure Pain Intervention(s): Limited activity within patient's tolerance, Monitored during session  Home Living                                          Prior Functioning/Environment               Frequency  Min 2X/week        Progress Toward Goals  OT Goals(current goals can now be found in the care plan section)  Progress towards OT goals: Progressing toward goals  Acute Rehab OT Goals Patient Stated Goal: go home OT Goal Formulation: With patient Time For Goal Achievement: 10/28/22 Potential to Achieve Goals: Good ADL Goals Pt Will Perform Lower Body Dressing: with modified independence;with adaptive equipment;sit to/from stand Pt Will Transfer to Toilet: with modified independence;ambulating  Plan Discharge plan remains appropriate    Co-evaluation                 AM-PAC OT "6 Clicks" Daily Activity     Outcome Measure   Help from another person eating meals?: None Help from another person taking care of personal grooming?: A Little Help from another person toileting, which includes using toliet, bedpan, or urinal?: A Little Help from another person bathing (including washing, rinsing, drying)?: A Lot Help from another person to put on and taking off regular upper body clothing?: A Little Help from another person to put on and taking off regular lower body clothing?: A Little 6 Click Score: 18    End of Session Equipment Utilized During Treatment: Other (comment) (adaptive equipment)  OT Visit Diagnosis: Unsteadiness on feet (R26.81);Other abnormalities of gait and mobility (R26.89);Muscle weakness (generalized) (M62.81);Pain   Activity Tolerance Patient tolerated treatment well   Patient Left in bed;with call bell/phone within reach (seated on EOB)   Nurse Communication Mobility status        Time: 0846-0858 OT Time Calculation (min): 12 min  Charges: OT General Charges $OT Visit: 1 Visit OT Treatments $Self Care/Home Management : 8-22 mins  Rick Owen, OTA Acute Rehabilitation Services  Office [phone]   Rickie L Owen 10/15/2022, 12:41 PM 

## 2022-10-16 MED FILL — Thrombin For Soln 5000 Unit: CUTANEOUS | Qty: 2 | Status: AC

## 2023-01-04 ENCOUNTER — Inpatient Hospital Stay: Payer: Medicare HMO | Attending: Hematology and Oncology | Admitting: Hematology and Oncology

## 2023-01-04 ENCOUNTER — Other Ambulatory Visit: Payer: Self-pay

## 2023-01-04 VITALS — BP 130/49 | HR 77 | Temp 97.9°F | Resp 19 | Ht 66.0 in | Wt 231.7 lb

## 2023-01-04 DIAGNOSIS — F17210 Nicotine dependence, cigarettes, uncomplicated: Secondary | ICD-10-CM | POA: Diagnosis not present

## 2023-01-04 DIAGNOSIS — Z8711 Personal history of peptic ulcer disease: Secondary | ICD-10-CM | POA: Diagnosis not present

## 2023-01-04 DIAGNOSIS — J449 Chronic obstructive pulmonary disease, unspecified: Secondary | ICD-10-CM | POA: Diagnosis not present

## 2023-01-04 DIAGNOSIS — D75839 Thrombocytosis, unspecified: Secondary | ICD-10-CM | POA: Diagnosis present

## 2023-01-04 DIAGNOSIS — K219 Gastro-esophageal reflux disease without esophagitis: Secondary | ICD-10-CM | POA: Insufficient documentation

## 2023-01-04 NOTE — Progress Notes (Signed)
[facility] NOTE  Patient Care Team: Leonard Downing, MD as PCP - General (Family Medicine)  CHIEF COMPLAINTS/PURPOSE OF CONSULTATION:  Thrombocytosis  HISTORY OF PRESENTING ILLNESS:  Amber Conrad 52 y.o. female is here because of elevated platelet counts.  Patient was first noted to have elevated platelet counts in October 2023 with a platelet count of 526.  In December 26, 2022 her platelet count went down to 507.  Because it is persistently elevated she was referred to [place] for further evaluation.  I reviewed her records extensively and collaborated the history with the patient.   MEDICAL HISTORY:  Past Medical History:  Diagnosis Date   Arthritis    COPD (chronic obstructive pulmonary disease) (HCC)    GERD (gastroesophageal reflux disease)    PONV (postoperative nausea and vomiting)    Ulcer     SURGICAL HISTORY: Past Surgical History:  Procedure Laterality Date   ABDOMINAL HYSTERECTOMY     BACK SURGERY     L5-S1 microdiscectomy   CHOLECYSTECTOMY     before 2013   LUMBAR LAMINECTOMY/DECOMPRESSION MICRODISCECTOMY Left 10/14/2022   Procedure: Microdiscectomy re-do  - left - Lumbar Five-Sacral One;  Surgeon: Eustace Moore, MD;  Location: [facility];  Service: Neurosurgery;  Laterality: Left;  3C   NECK SURGERY     C5/6    SOCIAL HISTORY: Social History   Socioeconomic History   Marital status: Married    Spouse name: [place]i Aber   Number of children: 1   Years of education: Not on file   Highest education level: Not on file  Occupational History   Not on file  Tobacco Use   Smoking status: Heavy Smoker    Packs/day: 2.00    Types: Cigarettes   Smokeless tobacco: Never  Vaping Use   Vaping Use: Never used  Substance and Sexual Activity   Alcohol use: Not Currently   Drug use: Never   Sexual activity: Not on file  Other Topics Concern   Not on file  Social History Narrative   Not on file   Social Determinants of Health    Financial Resource Strain: Not on file  Food Insecurity: Not on file  Transportation Needs: Not on file  Physical Activity: Not on file  Stress: Not on file  Social Connections: Not on file  Intimate Partner Violence: Not on file    FAMILY HISTORY: Family History  Problem Relation Age of Onset   Stroke Father    Heart disease Father    Cancer Father     ALLERGIES:  is allergic to sulfa antibiotics, tramadol, and z-pak [azithromycin].  MEDICATIONS:  Current Outpatient Medications  Medication Sig Dispense Refill   albuterol (VENTOLIN HFA) 108 (90 Base) MCG/ACT inhaler Inhale 2 puffs into the lungs every 6 (six) hours as needed for wheezing or shortness of breath.     amphetamine-dextroamphetamine (ADDERALL) 20 MG tablet Take 20 mg by mouth daily as needed (focusing).     Aspirin-Caffeine (BC FAST PAIN RELIEF PO) Take 1-2 packets by mouth 3 (three) times daily as needed (pain).     Budeson-Glycopyrrol-Formoterol (BREZTRI AEROSPHERE) 160-9-4.8 MCG/ACT AERO Inhale 2 puffs into the lungs 2 (two) times daily.     calcium carbonate (TUMS EX) 750 MG chewable tablet Chew 2-3 tablets by mouth 3 (three) times daily as needed for heartburn.     gabapentin (NEURONTIN) 300 MG capsule Take 1 capsule (300 mg total) by mouth 3 (three) times daily. 90 capsule 1  HYDROcodone-acetaminophen (NORCO) 10-325 MG tablet Take 1 tablet by mouth every 4 (four) hours as needed for moderate pain. 30 tablet 0   Melatonin 5 MG CHEW Chew 5-10 mg by mouth at bedtime as needed (sleep).     meloxicam (MOBIC) 7.5 MG tablet Take 1 tablet (7.5 mg total) by mouth 2 (two) times daily as needed for pain. 60 tablet 1   omeprazole (PRILOSEC) 40 MG capsule Take 40 mg by mouth daily.     No current facility-administered medications for this visit.    REVIEW OF SYSTEMS:   Constitutional: Denies fevers, chills or abnormal night sweats All other systems were reviewed with the patient and are negative.  PHYSICAL  EXAMINATION: ECOG PERFORMANCE STATUS: 1 - Symptomatic but completely ambulatory  Vitals:   01/04/23 1105  BP: (!) 130/49  Pulse: 77  Resp: 19  Temp: 97.9 F (36.6 C)  SpO2: 96%   Filed Weights   01/04/23 1105  Weight: 231 lb 11.2 oz (105.1 kg)    GENERAL:alert, no distress and comfortable  LABORATORY DATA:  I have reviewed the data as listed Lab Results  Component Value Date   WBC 15.0 (H) 10/10/2022   HGB 15.2 (H) 10/10/2022   HCT 46.7 (H) 10/10/2022   MCV 89.8 10/10/2022   PLT 526 (H) 10/10/2022   No results found for: "NA", "K", "CL", "CO2"  RADIOGRAPHIC STUDIES: I have personally reviewed the radiological reports and agreed with the findings in the report.  ASSESSMENT AND PLAN:  Thrombocytosis Lab review: 10/10/2022: WBC 15, hemoglobin 15.2, platelets 526 12/26/2022: WBC 10.4, hemoglobin 14.5, platelets 507   Differential diagnosis 1. Primary thrombocytosis: Related to myeloproliferative disorders of the bone marrow especially essential thrombocytosis and CML.  2. Secondary/reactive thrombocytosis Different causes including infections, inflammation, iron deficiency.  I would like to send out for C-reactive protein, iron studies with ferritin to complete the workup. Will also send for JAK2 mutation testing. Patient is also a plasma donor.  She donates plasma twice a week.  This could also be another potential reason for the slight elevation of platelet counts.  Treatment options: 1. If it is primary essential thrombocytosis, treatment would depend on platelet count level as well as history of thrombosis. A. For low risk patients, (platelet counts less than 1000 and no history of blood clots) the treatment would be with aspirin therapy B. for high risk patients(platelet counts greater than 1000/history of blood clot) the treatment would be platelet lowering therapy with aspirin 2. Treatment of secondary thrombocytosis would be to treat underlying cause. There  would not be any risk of thrombosis with secondary thrombocytosis.  Telephone visit in 2 weeks to discuss the results of these tests.     All questions were answered. The patient knows to call the clinic with any problems, questions or concerns.    Harriette Ohara, MD 01/04/23

## 2023-01-04 NOTE — Assessment & Plan Note (Signed)
Lab review: 10/10/2022: WBC 15, hemoglobin 15.2, platelets 526 12/26/2022: WBC 10.4, hemoglobin 14.5, platelets 507   Differential diagnosis 1. Primary thrombocytosis: Related to myeloproliferative disorders of the bone marrow especially essential thrombocytosis and CML.  2. Secondary/reactive thrombocytosis Different causes including infections, inflammation, iron deficiency.  I would like to send out for C-reactive protein, iron studies with ferritin to complete the workup. Will also send for JAK2 mutation testing.  Treatment options: 1. If it is primary essential thrombocytosis, treatment would depend on platelet count level as well as history of thrombosis. A. For low risk patients, (platelet counts less than 1000 and no history of blood clots) the treatment would be with aspirin therapy B. for high risk patients(platelet counts greater than 1000/history of blood clot) the treatment would be platelet lowering therapy with aspirin 2. Treatment of secondary thrombocytosis would be to treat underlying cause. There would not be any risk of thrombosis with secondary thrombocytosis.  Return to clinic in 3 weeks to discuss the results of these tests.

## 2023-01-06 ENCOUNTER — Other Ambulatory Visit: Payer: Self-pay

## 2023-01-06 ENCOUNTER — Inpatient Hospital Stay: Payer: Medicare HMO

## 2023-01-06 DIAGNOSIS — D75839 Thrombocytosis, unspecified: Secondary | ICD-10-CM | POA: Diagnosis not present

## 2023-01-06 LAB — CBC WITH DIFFERENTIAL (CANCER CENTER ONLY)
Abs Immature Granulocytes: 0.02 10*3/uL (ref 0.00–0.07)
Basophils Absolute: 0.1 10*3/uL (ref 0.0–0.1)
Basophils Relative: 1 %
Eosinophils Absolute: 0.4 10*3/uL (ref 0.0–0.5)
Eosinophils Relative: 4 %
HCT: 43 % (ref 36.0–46.0)
Hemoglobin: 14.2 g/dL (ref 12.0–15.0)
Immature Granulocytes: 0 %
Lymphocytes Relative: 36 %
Lymphs Abs: 3.4 10*3/uL (ref 0.7–4.0)
MCH: 28.8 pg (ref 26.0–34.0)
MCHC: 33 g/dL (ref 30.0–36.0)
MCV: 87.2 fL (ref 80.0–100.0)
Monocytes Absolute: 0.6 10*3/uL (ref 0.1–1.0)
Monocytes Relative: 6 %
Neutro Abs: 5.2 10*3/uL (ref 1.7–7.7)
Neutrophils Relative %: 53 %
Platelet Count: 437 10*3/uL — ABNORMAL HIGH (ref 150–400)
RBC: 4.93 MIL/uL (ref 3.87–5.11)
RDW: 13.2 % (ref 11.5–15.5)
WBC Count: 9.7 10*3/uL (ref 4.0–10.5)
nRBC: 0 % (ref 0.0–0.2)

## 2023-01-06 LAB — IRON AND IRON BINDING CAPACITY (CC-WL,HP ONLY)
Iron: 51 ug/dL (ref 28–170)
Saturation Ratios: 15 % (ref 10.4–31.8)
TIBC: 351 ug/dL (ref 250–450)
UIBC: 300 ug/dL (ref 148–442)

## 2023-01-06 LAB — FERRITIN: Ferritin: 15 ng/mL (ref 11–307)

## 2023-01-06 LAB — C-REACTIVE PROTEIN: CRP: 0.7 mg/dL (ref <1.0)

## 2023-01-13 LAB — JAK2 (INCLUDING V617F AND EXON 12), MPL,& CALR W/RFL MPN PANEL (NGS)

## 2023-01-20 ENCOUNTER — Inpatient Hospital Stay (HOSPITAL_BASED_OUTPATIENT_CLINIC_OR_DEPARTMENT_OTHER): Payer: Medicare HMO | Admitting: Hematology and Oncology

## 2023-01-20 DIAGNOSIS — D75839 Thrombocytosis, unspecified: Secondary | ICD-10-CM | POA: Diagnosis not present

## 2023-01-20 NOTE — Assessment & Plan Note (Signed)
Lab review: 10/10/2022: WBC 15, hemoglobin 15.2, platelets 526 12/26/2022: WBC 10.4, hemoglobin 14.5, platelets 507 01/06/2023: Elevated 9.7, hemoglobin 14.2, platelets 437, iron studies: Normal iron saturation 15%, CRP: 0.7, ferritin 15  MPN panel: Variants of potential significance: ASXL 1 (considered to be a prognostic marker if patient has existing hematological disorders like MDS or AML or CMML.  Not considered anything of significance in the current condition)  Return to clinic with labs and follow-up in 4 months

## 2023-01-20 NOTE — Progress Notes (Signed)
HEMATOLOGY-ONCOLOGY TELEPHONE VISIT PROGRESS NOTE  I connected with our patient on 01/20/23 at 10:00 AM EST by telephone and verified that I am speaking with the correct person using two identifiers.  I discussed the limitations, risks, security and privacy concerns of performing an evaluation and management service by telephone and the availability of in person appointments.  I also discussed with the patient that there may be a patient responsible charge related to this service. The patient expressed understanding and agreed to proceed.   History of Present Illness: Amber Conrad 52 y.o. female is here because of elevated platelet counts.  Patient was first noted to have elevated platelet counts in October 2023 with a platelet count of 526.  In December 26, 2022 her platelet count went down to 507.  Because it is persistently elevated she was referred to [place] for further evaluation. She presents to the clinic telephone visit to discuss test results.  REVIEW OF SYSTEMS:   Constitutional: Denies fevers, chills or abnormal weight loss All other systems were reviewed with the patient and are negative. Observations/Objective:     Assessment Plan:  Thrombocytosis Lab review: 10/10/2022: WBC 15, hemoglobin 15.2, platelets 526 12/26/2022: WBC 10.4, hemoglobin 14.5, platelets 507 01/06/2023: Elevated 9.7, hemoglobin 14.2, platelets 437, iron studies: Normal iron saturation 15%, CRP: 0.7, ferritin 15  MPN panel: Variants of potential significance: ASXL 1 (considered to be a prognostic marker if patient has existing hematological disorders like MDS or AML or CMML.  Not considered anything of significance in the current condition)   Left adrenal nodule: By CT scans done on 06/18/2020 it was felt to be stable I informed her that the blood work does not have any correlation to the adrenal nodule.  Return to clinic with labs and follow-up in 4 months and after that she could be seen on an as-needed  basis.   I discussed the assessment and treatment plan with the patient. The patient was provided an opportunity to ask questions and all were answered. The patient agreed with the plan and demonstrated an understanding of the instructions. The patient was advised to call back or seek an in-person evaluation if the symptoms worsen or if the condition fails to improve as anticipated.   I provided 12 minutes of non-face-to-face time during this encounter.  This includes time for charting and coordination of care   Harriette Ohara, MD  I Gardiner Coins am acting as a scribe for Dr.Marketta Valadez  I have reviewed the above documentation for accuracy and completeness, and I agree with the above.

## 2023-06-06 ENCOUNTER — Telehealth: Payer: Self-pay | Admitting: Hematology and Oncology

## 2023-06-28 NOTE — Progress Notes (Signed)
   Patient Care Team: Kaleen Mask, MD as PCP - General (Family Medicine)  DIAGNOSIS: No diagnosis found.  SUMMARY OF ONCOLOGIC HISTORY: Oncology History   No history exists.    CHIEF COMPLIANT: Follow of elevated platelet counts  INTERVAL HISTORY: Amber Conrad is a 52 y.o. female is here because of elevated platelet counts. She presents to the clinic for a follow-up.    ALLERGIES:  is allergic to sulfa antibiotics, tramadol, and z-pak [azithromycin].  MEDICATIONS:  Current Outpatient Medications  Medication Sig Dispense Refill   albuterol (VENTOLIN HFA) 108 (90 Base) MCG/ACT inhaler Inhale 2 puffs into the lungs every 6 (six) hours as needed for wheezing or shortness of breath.     amphetamine-dextroamphetamine (ADDERALL) 20 MG tablet Take 20 mg by mouth daily as needed (focusing).     Aspirin-Caffeine (BC FAST PAIN RELIEF PO) Take 1-2 packets by mouth 3 (three) times daily as needed (pain).     Budeson-Glycopyrrol-Formoterol (BREZTRI AEROSPHERE) 160-9-4.8 MCG/ACT AERO Inhale 2 puffs into the lungs 2 (two) times daily.     calcium carbonate (TUMS EX) 750 MG chewable tablet Chew 2-3 tablets by mouth 3 (three) times daily as needed for heartburn.     gabapentin (NEURONTIN) 300 MG capsule Take 1 capsule (300 mg total) by mouth 3 (three) times daily. 90 capsule 1   HYDROcodone-acetaminophen (NORCO) 10-325 MG tablet Take 1 tablet by mouth every 4 (four) hours as needed for moderate pain. 30 tablet 0   Melatonin 5 MG CHEW Chew 5-10 mg by mouth at bedtime as needed (sleep).     meloxicam (MOBIC) 7.5 MG tablet Take 1 tablet (7.5 mg total) by mouth 2 (two) times daily as needed for pain. 60 tablet 1   omeprazole (PRILOSEC) 40 MG capsule Take 40 mg by mouth daily.     No current facility-administered medications for this visit.    PHYSICAL EXAMINATION: ECOG PERFORMANCE STATUS: {CHL ONC ECOG PS:[id number]}  There were no vitals filed for this visit. There were no vitals filed  for this visit.  BREAST:*** No palpable masses or nodules in either right or left breasts. No palpable axillary supraclavicular or infraclavicular adenopathy no breast tenderness or nipple discharge. (exam performed in the presence of a chaperone)  LABORATORY DATA:  I have reviewed the data as listed     No data to display          Lab Results  Component Value Date   WBC 9.7 01/06/2023   HGB 14.2 01/06/2023   HCT 43.0 01/06/2023   MCV 87.2 01/06/2023   PLT 437 (H) 01/06/2023   NEUTROABS 5.2 01/06/2023    ASSESSMENT & PLAN:  No problem-specific Assessment & Plan notes found for this encounter.    No orders of the defined types were placed in this encounter.  The patient has a good understanding of the overall plan. she agrees with it. she will call with any problems that may develop before the next visit here. Total time spent: 30 mins including face to face time and time spent for planning, charting and co-ordination of care   Sherlyn Lick, CMA 06/28/23    I Janan Ridge am acting as a Neurosurgeon for [employer]  ***

## 2023-07-01 ENCOUNTER — Other Ambulatory Visit: Payer: Self-pay

## 2023-07-01 ENCOUNTER — Inpatient Hospital Stay (HOSPITAL_BASED_OUTPATIENT_CLINIC_OR_DEPARTMENT_OTHER): Payer: Medicare HMO | Admitting: Hematology and Oncology

## 2023-07-01 ENCOUNTER — Inpatient Hospital Stay: Payer: Medicare HMO | Attending: Hematology and Oncology

## 2023-07-01 VITALS — BP 129/57 | HR 64 | Temp 97.6°F | Resp 18 | Ht 66.0 in | Wt 226.6 lb

## 2023-07-01 DIAGNOSIS — D75839 Thrombocytosis, unspecified: Secondary | ICD-10-CM | POA: Diagnosis present

## 2023-07-01 DIAGNOSIS — Z79899 Other long term (current) drug therapy: Secondary | ICD-10-CM | POA: Insufficient documentation

## 2023-07-01 LAB — CBC WITH DIFFERENTIAL (CANCER CENTER ONLY)
Abs Immature Granulocytes: 0.04 10*3/uL (ref 0.00–0.07)
Basophils Absolute: 0.1 10*3/uL (ref 0.0–0.1)
Basophils Relative: 1 %
Eosinophils Absolute: 0.2 10*3/uL (ref 0.0–0.5)
Eosinophils Relative: 2 %
HCT: 44 % (ref 36.0–46.0)
Hemoglobin: 14.7 g/dL (ref 12.0–15.0)
Immature Granulocytes: 0 %
Lymphocytes Relative: 22 %
Lymphs Abs: 2.1 10*3/uL (ref 0.7–4.0)
MCH: 28.9 pg (ref 26.0–34.0)
MCHC: 33.4 g/dL (ref 30.0–36.0)
MCV: 86.6 fL (ref 80.0–100.0)
Monocytes Absolute: 0.5 10*3/uL (ref 0.1–1.0)
Monocytes Relative: 6 %
Neutro Abs: 6.8 10*3/uL (ref 1.7–7.7)
Neutrophils Relative %: 69 %
Platelet Count: 429 10*3/uL — ABNORMAL HIGH (ref 150–400)
RBC: 5.08 MIL/uL (ref 3.87–5.11)
RDW: 14.1 % (ref 11.5–15.5)
WBC Count: 9.8 10*3/uL (ref 4.0–10.5)
nRBC: 0 % (ref 0.0–0.2)

## 2023-07-01 NOTE — Assessment & Plan Note (Signed)
Lab review: 10/10/2022: WBC 15, hemoglobin 15.2, platelets 526 12/26/2022: WBC 10.4, hemoglobin 14.5, platelets 507 01/06/2023: Elevated 9.7, hemoglobin 14.2, platelets 437, iron studies: Normal iron saturation 15%, CRP: 0.7, ferritin 15   MPN panel: Variants of potential significance: ASXL 1 (considered to be a prognostic marker if patient has existing hematological disorders like MDS or AML or CMML.  Not considered anything of significance in the current condition)   Left adrenal nodule: By CT scans done on 06/18/2020 it was felt to be stable   Return to clinic with labs and follow-up in 4 months and after that she could be seen on an as-needed basis.

## 2023-07-08 ENCOUNTER — Other Ambulatory Visit: Payer: Self-pay | Admitting: Family Medicine

## 2023-07-08 DIAGNOSIS — Z1231 Encounter for screening mammogram for malignant neoplasm of breast: Secondary | ICD-10-CM

## 2023-07-11 ENCOUNTER — Ambulatory Visit
Admission: RE | Admit: 2023-07-11 | Discharge: 2023-07-11 | Disposition: A | Discharge status: Home / Self Care | Payer: Medicare HMO | Source: Ambulatory Visit | Attending: Family Medicine | Admitting: Family Medicine

## 2023-07-11 DIAGNOSIS — Z1231 Encounter for screening mammogram for malignant neoplasm of breast: Secondary | ICD-10-CM

## 2023-10-01 ENCOUNTER — Encounter: Payer: Self-pay | Admitting: Internal Medicine

## 2023-10-01 ENCOUNTER — Ambulatory Visit: Payer: Medicare HMO | Attending: Internal Medicine | Admitting: Internal Medicine

## 2023-10-01 ENCOUNTER — Telehealth: Payer: Self-pay

## 2023-10-01 VITALS — BP 118/70 | HR 67 | Ht 65.5 in | Wt 226.0 lb

## 2023-10-01 DIAGNOSIS — R072 Precordial pain: Secondary | ICD-10-CM | POA: Diagnosis not present

## 2023-10-01 DIAGNOSIS — Z720 Tobacco use: Secondary | ICD-10-CM | POA: Insufficient documentation

## 2023-10-01 DIAGNOSIS — Z0000 Encounter for general adult medical examination without abnormal findings: Secondary | ICD-10-CM

## 2023-10-01 DIAGNOSIS — Z8249 Family history of ischemic heart disease and other diseases of the circulatory system: Secondary | ICD-10-CM | POA: Diagnosis not present

## 2023-10-01 MED ORDER — IVABRADINE HCL 7.5 MG PO TABS: 15.0000 mg | ORAL_TABLET | Freq: Once | ORAL | 0 refills | Status: AC

## 2023-10-01 MED ORDER — METOPROLOL TARTRATE 100 MG PO TABS: ORAL_TABLET | ORAL | 0 refills | Status: DC

## 2023-10-01 NOTE — Patient Instructions (Addendum)
Medication Instructions:  Your physician recommends that you continue on your current medications as directed. Please refer to the Current Medication list given to you today.  *If you need a refill on your cardiac medications before your next appointment, please call your pharmacy*   Lab Work: FLP, Lpa, BMP  If you have labs (blood work) drawn today and your tests are completely normal, you will receive your results only by: MyChart Message (if you have MyChart) OR A paper copy in the mail If you have any lab test that is abnormal or we need to change your treatment, we will call you to review the results.   Testing/Procedures: Your physician has referred you to see a Health Coach for tobacco use.    Your physician has requested that you have cardiac CT. Cardiac computed tomography (CT) is a painless test that uses an x-ray machine to take clear, detailed pictures of your heart. For further information please visit [email or web address] Please follow instruction sheet as given.     Follow-Up: At [facility], you and your health needs are our priority.  As part of our continuing mission to provide you with exceptional heart care, we have created designated Provider Care Teams.  These Care Teams include your primary Cardiologist (physician) and Advanced Practice Providers (APPs -  Physician Assistants and Nurse Practitioners) who all work together to provide you with the care you need, when you need it.     Your next appointment:   3 month(s)  Provider:   Jari Favre, PA-C, Ronie Spies, PA-C, Robin Searing, NP, Jacolyn Reedy, PA-C, Eligha Bridegroom, NP, Tereso Newcomer, PA-C, or Perlie Gold, PA-C       Other Instructions  Your cardiac CT will be scheduled at one of the below locations:   [facility] [address] [place], [place] [address] [phone]  OR  [facility] [address] [place], [place]  [address] [phone]  OR   [facility] [address] [place], [place] [address] [phone]  If scheduled at [facility], please arrive at the [facility] and Children's Entrance (Entrance C2) of [facility] 30 minutes prior to test start time. You can use the FREE valet parking offered at entrance C (encouraged to control the heart rate for the test)  Proceed to the [facility] Radiology Department (first floor) to check-in and test prep.  All radiology patients and guests should use entrance C2 at [facility], accessed from [facility], even though the hospital's physical address listed is [address].    If scheduled at [facility] or [facility], please arrive 15 mins early for check-in and test prep.  There is spacious parking and easy access to the radiology department from the [facility] Heart and Vascular entrance. Please enter here and check-in with the desk attendant.   Please follow these instructions carefully (unless otherwise directed):  An IV will be required for this test and Nitroglycerin will be given.  Hold all erectile dysfunction medications at least 3 days (72 hrs) prior to test. (Ie viagra, cialis, sildenafil, tadalafil, etc)   On the Night Before the Test: Be sure to Drink plenty of water. Do not consume any caffeinated/decaffeinated beverages or chocolate 12 hours prior to your test. Do not take any antihistamines 12 hours prior to your test.   On the Day of the Test: Drink plenty of water until 1  hour prior to the test. Do not eat any food 1 hour prior to test. You may take your regular medications prior to the test.  Take corlanor (Ivabradine) 15 mg two hours prior to test. If you take Furosemide/Hydrochlorothiazide/Spironolactone, please HOLD on the morning of the test. FEMALES- please wear underwire-free bra if available, avoid dresses &  tight clothing       After the Test: Drink plenty of water. After receiving IV contrast, you may experience a mild flushed feeling. This is normal. On occasion, you may experience a mild rash up to 24 hours after the test. This is not dangerous. If this occurs, you can take Benadryl 25 mg and increase your fluid intake. If you experience trouble breathing, this can be serious. If it is severe call 911 IMMEDIATELY. If it is mild, please call our office. If you take any of these medications: Glipizide/Metformin, Avandament, Glucavance, please do not take 48 hours after completing test unless otherwise instructed.  We will call to schedule your test 2-4 weeks out understanding that some insurance companies will need an authorization prior to the service being performed.   For more information and frequently asked questions, please visit our website : [email or web address]  For non-scheduling related questions, please contact the cardiac imaging nurse navigator should you have any questions/concerns: Cardiac Imaging Nurse Navigators Direct Office Dial: [phone]   For scheduling needs, including cancellations and rescheduling, please call [place], [phone].

## 2023-10-01 NOTE — Progress Notes (Signed)
Cardiology Office Note:  .    Date:  10/01/2023  ID:  Amber Conrad, DOB 1971-12-02, MRN [id number] PCP: Kaleen Mask, MD  [facility] Providers Cardiologist:  Christell Constant, MD     CC: Chest pain Consulted for the evaluation of chest pain at the behest of Dr. Jeannetta Nap  History of Present Illness: .    Amber Conrad is a 52 y.o. female with a history of smoking and family history of CAD presenting with chest pain  Discussed the use of AI scribe software for clinical note transcription with the patient, who gave verbal consent to proceed.  History of Present Illness         [name], a 52 year old with a history of obstructive lung disease and asymptomatic thrombocytosis, presents with chest discomfort. The patient describes the discomfort as a feeling of tightness and heaviness in the chest, which she experienced last month. She also reports shortness of breath, particularly when walking. The patient has a history of smoking since the age of 80 and continues to smoke  Had nightmares on Chantix . She has a family history of heart disease, with both parents having heart problems. The patient also has a small hiatal hernia and has had neck and back pains in the past. She is currently on Protonix 40mg  daily for GERD and hiatal hernia.  She has hot flashes.  All of these symptoms are chronic save for chest pain and tightness  Relevant histories: .  Social- long time smoker Family history: - Father had coronary artery disease in 32s (he is deceased) - Mother has atrial fibrillation - Mother has patent foramen ovale ROS: As per HPI.   Studies Reviewed: Marland Kitchen       RADIOLOGY Chest CT: Aortic atherosclerosis, emphysematous changes, small hiatal hernia (2021)  DIAGNOSTIC EKG: Sinus rhythm (10/11/2020) EKG: New Q waves in III and AVF (10/01/2023)     Physical Exam:    VS:  BP 118/70   Pulse 67   Ht 5' 5.5" (1.664 m)   Wt 226 lb (102.5 kg)   SpO2 94%   BMI  37.04 kg/m    Wt Readings from Last 3 Encounters:  10/01/23 226 lb (102.5 kg)  07/01/23 226 lb 9.6 oz (102.8 kg)  01/04/23 231 lb 11.2 oz (105.1 kg)    Gen: no distress, obesity   Neck: No JVD Cardiac: No Rubs or Gallops, no murmur, RRR +2 radial pulses Respiratory: Expiratory wheezes bilaterally, normal effort, normal  respiratory rate GI: Soft, nontender, non-distended  MS: No  edema;  moves all extremities Integument: Skin feels warm Neuro:  At time of evaluation, alert and oriented to person/place/time/situation  Psych: Anxious affect, patient feels anxious   ASSESSMENT AND PLAN: .    Chest Pain - Precordial chest pain with a history of smoking, aortic atherosclerosis, and family history of early coronary artery disease. She is largely sedentary -Order Cardiac CT scan to rule out significant coronary artery disease; she notes her BP in the last month has been closer to 130/80 (new to her), she should be able to tolerate metoprolol for the scan, but may need a ~ 500 cc bolus on day of exam -Order fasting lipids and LP(a) to assess for other risk factors.  Tobacco Use - Long history of smoking with a desire to quit but has had difficulty with previous cessation attempts. -Refer to healthcare coach for smoking cessation support. - nightmares on Chantix  Hyperlipidemia -LDL goal under 70  due to aortic atherosclerosis. -Will discuss further management after lipid panel results.  Follow-up Plan for follow-up in 3 months with an Editor, commissioning (PA or NP) for secondary prevention unless significant findings on Cardiac CT scan (obstructive CAD).   Amber Lam, MD FASE [facility] Cardiologist [facility]  [address] [place], [address] [place], [place] [address] [phone]  9:05 AM

## 2023-10-01 NOTE — Telephone Encounter (Signed)
Returned patient's call regarding interest in health coaching for smoking cessation. Patient has been scheduled for her initial session on 10/4 at 9:00am. Patient will be called at this time.     Renaee Munda, MS, ERHD, [facility]  Care Guide, Health & Wellness Coach [address] [place] [address] Telephone: [phone] Email: [email or web address]

## 2023-10-01 NOTE — Addendum Note (Signed)
Addended by: Macie Burows on: 10/01/2023 09:12 AM   Modules accepted: Orders

## 2023-10-01 NOTE — Telephone Encounter (Signed)
Called patient per health coaching referral for smoking cessation. Patient did not answer. Left message for patient to return call.    Renaee Munda, MS, ERHD, [facility]  Care Guide, Health & Wellness Coach [address], [address] [place] [address] Telephone: [phone] Email: [email or web address]

## 2023-10-02 LAB — BASIC METABOLIC PANEL
BUN/Creatinine Ratio: 14 (ref 9–23)
BUN: 11 mg/dL (ref 6–24)
CO2: 25 mmol/L (ref 20–29)
Calcium: 9.6 mg/dL (ref 8.7–10.2)
Chloride: 105 mmol/L (ref 96–106)
Creatinine, Ser: 0.78 mg/dL (ref 0.57–1.00)
Glucose: 92 mg/dL (ref 70–99)
Potassium: 4.5 mmol/L (ref 3.5–5.2)
Sodium: 143 mmol/L (ref 134–144)
eGFR: 90 mL/min/{1.73_m2} (ref >59)

## 2023-10-02 LAB — LIPID PANEL
Chol/HDL Ratio: 5.5 {ratio} — ABNORMAL HIGH (ref 0.0–4.4)
Cholesterol, Total: 242 mg/dL — ABNORMAL HIGH (ref 100–199)
HDL: 44 mg/dL (ref >39)
LDL Chol Calc (NIH): 151 mg/dL — ABNORMAL HIGH (ref 0–99)
Triglycerides: 258 mg/dL — ABNORMAL HIGH (ref 0–149)
VLDL Cholesterol Cal: 47 mg/dL — ABNORMAL HIGH (ref 5–40)

## 2023-10-02 LAB — LIPOPROTEIN A (LPA): Lipoprotein (a): 44.6 nmol/L (ref <75.0)

## 2023-10-03 ENCOUNTER — Telehealth: Payer: Self-pay

## 2023-10-03 ENCOUNTER — Ambulatory Visit: Payer: Medicare HMO | Attending: Cardiology

## 2023-10-03 DIAGNOSIS — Z0000 Encounter for general adult medical examination without abnormal findings: Secondary | ICD-10-CM

## 2023-10-03 MED ORDER — ROSUVASTATIN CALCIUM 5 MG PO TABS: 5.0000 mg | ORAL_TABLET | Freq: Every day | ORAL | 3 refills | Status: DC

## 2023-10-03 MED ORDER — NICOTINE 14 MG/24HR TD PT24: 14.0000 mg | MEDICATED_PATCH | Freq: Every day | TRANSDERMAL | 0 refills | Status: DC

## 2023-10-03 NOTE — Telephone Encounter (Signed)
-----   Message from Christell Constant sent at 10/03/2023  2:35 PM EDT ----- Results: Abnormal LDL Plan: Rosuvastatin 5 mg  Christell Constant, MD

## 2023-10-03 NOTE — Telephone Encounter (Signed)
The patient has been notified of the result and verbalized understanding.  All questions (if any) were answered. Amber Burows, RN 10/03/2023 3:18 PM   Pt will have f/u labs at next OV with Swinyer, NP on 01/18/23. MD gave verbal order to start pt on nicotine patch mg/24hr #30 no refills.

## 2023-10-03 NOTE — Progress Notes (Signed)
HEALTH & WELLNESS COACHING INITIAL INTAKE    Appointment Outcome:  Completed, Session #: Initial Start time: 9:01am   End time: 9:36am   Total Mins: 35 minutes    What are the Patient's goals from Coaching?  Smoking cessation   Why did they seek coaching now? Patient desires to improve the longevity and quality of her life. Patient states that smoking is an addiction she wants to quit.   Readiness  What stage is the patient in regarding their goal(s)?  Preparation   Coaching Progress Notes:  Patient shared that she has been smoking for approximately 40 years. Patient reported that she had tried to quit smoking by taking Chantix on two different occasions, but it caused her to have nightmares each time. Patient stated that she was able to use nicotine gum to refrain from smoking cigarettes for 30 days until she was triggered at work while around others who smoked.   Patient reported that she typically smokes an average of 2 packs of cigarettes per day and sometimes more depending on her stress level. Patient shared that she has a lot of responsibilities that have fallen on her that causes her to become overwhelmed. Patient mentioned that to relax herself she drinks coffee and smoke throughout the day.   Patient stated that she stopped smoking after she finished her last four cigarettes last night and didn't go buy another pack. Patient shared that she was doing okay until the session started and now, she feels triggered to smoke and wants to go buy a pack of cigarettes.  Patient stated that she wants to quit smoking but because of the addiction, she finds it hard. Discussed with patient how she smokes to cope with stress/physical pain. Patient explained that she wants to be able to breathe better and live longer for her family. Patient stated that she doesn't like the smell of smoke in her car.     Coaching Outcomes Discussed with patient strategies that she would like to  incorporate over the next two weeks to aid in refraining from smoking. Patient stated that she had not tried the patches but had some concerns. Discussed with the patient the proper usage of nicotine patches. Patient is aware of not smoking with patch on. Patient is interested in purchasing nicotine gum in the meantime to aid in refraining from smoking since she has already stopped the night prior. Patient stated that she would purchase some gum today.   Sent staff message to Dr. Freda Jackson and Elmer Sow regarding prescription for nicotine patches.    AGREEMENTS SECTION   Overall Goal(s): Smoking cessation Stress management                                             Agreement/Action Steps:  Smoking cessation Refrain from purchasing/borrowing cigarettes Purchase nicotine gum Chew nicotine gum when urge to smoke is intense (examples: after waking up, after eating/coffee, stressed/relaxing, when reading and/or before bed) Pick up nicotine patch prescription when available Wear nicotine patch as prescribed  Stress management Practice deep breathing when feeling overwhelmed Can be implemented when urge to smoke occurs    Agreement Signed & Returned? Reviewed Coaching Agreement and Code of Ethics with Patient during initial session. Answered any questions the patient had if any regarding the Coaching Agreement and Code of Ethics. Patient verbally agreed to adhere to the Coaching Agreement and  to abide by the Code of Ethics.  Mailed patient with a hard/electronic copy of the Coaching Agreement and Code of Ethics.   Resources: Emailed/mailed patient a copy of deep breathing exercises. Emailed patient an outline of action plan.

## 2023-10-07 ENCOUNTER — Telehealth (HOSPITAL_COMMUNITY): Payer: Self-pay | Admitting: Emergency Medicine

## 2023-10-07 NOTE — Telephone Encounter (Signed)
Reaching out to patient to offer assistance regarding upcoming cardiac imaging study; pt verbalizes understanding of appt date/time, parking situation and where to check in, pre-test NPO status and medications ordered, and verified current allergies; name and call back number provided for further questions should they arise Rockwell Alexandria RN Navigator Cardiac Imaging [facility] [phone] office [phone] cell  Holding adderall, BC headache powder 100mg  metoprolol for HR control

## 2023-10-09 ENCOUNTER — Ambulatory Visit (HOSPITAL_COMMUNITY)
Admission: RE | Admit: 2023-10-09 | Discharge: 2023-10-09 | Disposition: A | Discharge status: Home / Self Care | Payer: Medicare HMO | Source: Ambulatory Visit | Attending: Internal Medicine | Admitting: Internal Medicine

## 2023-10-09 DIAGNOSIS — R072 Precordial pain: Secondary | ICD-10-CM | POA: Diagnosis present

## 2023-10-09 MED ORDER — NITROGLYCERIN 0.4 MG SL SUBL
0.8000 mg | SUBLINGUAL_TABLET | Freq: Once | SUBLINGUAL | Status: AC
  Administered 2023-10-09: 0.8 mg via SUBLINGUAL

## 2023-10-09 MED ORDER — NITROGLYCERIN 0.4 MG SL SUBL
SUBLINGUAL_TABLET | SUBLINGUAL | Status: AC
  Filled 2023-10-09: qty 2

## 2023-10-09 MED ORDER — IOHEXOL 350 MG/ML SOLN
95.0000 mL | Freq: Once | INTRAVENOUS | Status: AC | PRN
  Administered 2023-10-09: 95 mL via INTRAVENOUS

## 2023-10-13 ENCOUNTER — Encounter: Payer: Self-pay | Admitting: Internal Medicine

## 2023-10-17 ENCOUNTER — Ambulatory Visit: Payer: Medicare HMO

## 2023-10-17 ENCOUNTER — Telehealth: Payer: Self-pay

## 2023-10-17 DIAGNOSIS — Z0000 Encounter for general adult medical examination without abnormal findings: Secondary | ICD-10-CM

## 2023-10-17 NOTE — Telephone Encounter (Signed)
Called patient to hold health coaching appointment over the phone. Patient stated that it was not a good time to hold session and needed to reschedule. Patient requested to be rescheduled for 10/20/23 at 9:00am. Patient has been rescheduled as requested and will be called at that time.    Renaee Munda, MS, ERHD, [facility]  Care Guide, Health & Wellness Coach [address], [address] [place] [address] Telephone: [phone] Email: [email or web address]

## 2023-10-20 ENCOUNTER — Ambulatory Visit: Payer: Medicare HMO

## 2023-10-20 ENCOUNTER — Telehealth: Payer: Self-pay

## 2023-10-20 DIAGNOSIS — Z0000 Encounter for general adult medical examination without abnormal findings: Secondary | ICD-10-CM

## 2023-10-20 NOTE — Telephone Encounter (Signed)
Called patient to hold health coaching session as scheduled over the phone. Patient did not answer. Left a message for patient to return call to hold session today or to reschedule if necessary.   Renaee Munda, MS, ERHD, [facility]  Care Guide, Health & Wellness Coach [address] [place] [address] Telephone: [phone] Email: [email or web address]

## 2023-11-05 ENCOUNTER — Telehealth: Payer: Self-pay

## 2023-11-05 DIAGNOSIS — Z0000 Encounter for general adult medical examination without abnormal findings: Secondary | ICD-10-CM

## 2023-11-05 NOTE — Telephone Encounter (Signed)
Called patient to reschedule health coaching session. Patient did not answer. Left message for patient to return call.    Renaee Munda, MS, ERHD, [facility]  Care Guide, Health & Wellness Coach [address] [place] [address] Telephone: [phone] Email: [email or web address]

## 2023-11-10 ENCOUNTER — Telehealth: Payer: Self-pay

## 2023-11-10 DIAGNOSIS — Z0000 Encounter for general adult medical examination without abnormal findings: Secondary | ICD-10-CM

## 2023-11-10 NOTE — Telephone Encounter (Signed)
Called to determine if patient wanted to continue in health coaching program and reschedule her missed appointment. Patient did not answer. Left message for patient to return call.    Renaee Munda, MS, ERHD, [facility]  Care Guide, Health & Wellness Coach [address] [place] [address] Telephone: [phone] Email: [email or web address]

## 2024-01-15 NOTE — Progress Notes (Deleted)
  Cardiology Office Note:  .   Date:  01/15/2024  ID:  Amber Conrad, DOB [birth year], MRN [id number] PCP: Kaleen Mask, MD  [facility] Providers Cardiologist:  Christell Constant, MD { Click to update primary MD,subspecialty MD or APP then REFRESH:1}   Patient Profile: .      PMH Tobacco dependence Aortic atherosclerosis Family history early CAD Father - CAD in his 72s Mother has atrial fib and PFO Thrombocytosis Obstructive lung disease GERD and hiatal hernia Hyperlipidemia  Referred to cardiology and seen by Dr. Raynelle Jan on 10/01/2023 for chest pain.  She reported a feeling of tightness and heaviness in her chest which she experienced the month prior.  She also reports shortness of breath, particularly when walking.  Has been smoking since the age of 42.  Had nightmares on Chantix.  She also has a family history of heart disease, with both parents having heart problems, her father while he was in his 60s.  She also has a small hiatal hernia and takes Protonix 40 mg daily for GERD.  She has had neck and back problems in the past.  She is also experiencing hot flashes.  EKG revealed new Q waves in leads III and aVF.  She leads a largely sedentary lifestyle.  She expressed a desire to quit smoking but has had difficulty with previous cessation attempts.  She was referred to health care coach for support.  Lipid panel obtained that day revealed total cholesterol 242, triglycerides 258, LDL 151, and HDL 44.  Lipoprotein a not elevated.  Coronary CTA was ordered and completed 10/09/2023 which revealed mild CAD in distal RCA (25 to 49%) total plaque volume 26 mm which is 53rd percentile for age/sex matched controls, coronary calcium score of 0.  She was advised to start rosuvastatin 5 mg daily and return for follow-up in 3 months.       History of Present Illness: .   Amber Conrad is a *** 53 y.o. female who is here today for follow-up of chest pain.    Discussed the  use of AI scribe software for clinical note transcription with the patient, who gave verbal consent to proceed.   ROS: ***       Studies Reviewed: .        *** Risk Assessment/Calculations:   {Does this patient have ATRIAL FIBRILLATION?:[phone]} No BP recorded.  {Refresh Note OR Click here to enter BP  :1}***       Physical Exam:   VS:  There were no vitals taken for this visit.   Wt Readings from Last 3 Encounters:  10/01/23 226 lb (102.5 kg)  07/01/23 226 lb 9.6 oz (102.8 kg)  01/04/23 231 lb 11.2 oz (105.1 kg)    GEN: Well nourished, well developed in no acute distress NECK: No JVD; No carotid bruits CARDIAC: ***RRR, no murmurs, rubs, gallops RESPIRATORY:  Clear to auscultation without rales, wheezing or rhonchi  ABDOMEN: Soft, non-tender, non-distended EXTREMITIES:  No edema; No deformity     ASSESSMENT AND PLAN: .    Chest pain:  CAD: Mild nonobstructive CAD on CCTA 10/09/23. Hyperlipidemia LDL goal < 70/Aortic atherosclerosis: Tobacco dependence:    {Are you ordering a CV Procedure (e.g. stress test, cath, DCCV, TEE, etc)?   Press F2        :[id number]}  Dispo: ***  Signed, Eligha Bridegroom, NP-C

## 2024-01-19 ENCOUNTER — Ambulatory Visit: Payer: Medicare HMO | Admitting: Nurse Practitioner

## 2024-03-10 ENCOUNTER — Telehealth: Payer: Self-pay | Admitting: Internal Medicine

## 2024-03-10 DIAGNOSIS — Z8249 Family history of ischemic heart disease and other diseases of the circulatory system: Secondary | ICD-10-CM

## 2024-03-10 DIAGNOSIS — Z720 Tobacco use: Secondary | ICD-10-CM

## 2024-03-10 DIAGNOSIS — R072 Precordial pain: Secondary | ICD-10-CM

## 2024-03-10 NOTE — Telephone Encounter (Signed)
 Pt c/o of Chest Pain: STAT if active CP, including tightness, pressure, jaw pain, radiating pain to shoulder/upper arm/back, CP unrelieved by Nitro. Symptoms reported of SOB, nausea, vomiting, sweating.  1. Are you having CP right now? No     2. Are you experiencing any other symptoms (ex. SOB, nausea, vomiting, sweating)? SOB   3. Is your CP continuous or coming and going? Comes and goes    4. Have you taken Nitroglycerin? No    5. How long have you been experiencing CP? before I was seen by yall.     6. If NO CP at time of call then end call with telling Pt to call back or call 911 if Chest pain returns prior to return call from triage team.   Answers received via pt schedules. Please advise.

## 2024-03-10 NOTE — Telephone Encounter (Signed)
 Called pt in regards to CP.  Pt reports CP is the same from previous visit with MD.  Calling in today because CP has not improved. Pt reports CP occurs almost daily takes a BC powder and improves in about 30 minutes.  Occurs with and without activity.  Pt has a hx of neck fusion from 20 years ago so often has neck and arm pain.  Denies jaw and shoulder pain.  Has SOB d/t emphysema continues to smoke.  Has indigestion but also has a hx of hernia takes pantoprazole 40 mg PO every day.  Reports BP runs 110/58 doesn't know HR.   Pt reports takes rosuvastatin 5 mg PO every day.  Advised pt she did not have f/u lab work as ordered.  Pt reports will have done soon advised pt to go to [facility] for f/u labs.  Pt canceled f/u OV with APP in Jan.  Scheduled OV with MD 04/08/24 at 11:40 am.  Advised pt to call 911 for evaluation of CP increases or is bothersome.  Pt expresses understanding.

## 2024-03-17 LAB — LIPID PANEL
Chol/HDL Ratio: 4.4 ratio (ref 0.0–4.4)
Cholesterol, Total: 140 mg/dL (ref 100–199)
HDL: 32 mg/dL — ABNORMAL LOW (ref >39)
LDL Chol Calc (NIH): 69 mg/dL (ref 0–99)
Triglycerides: 236 mg/dL — ABNORMAL HIGH (ref 0–149)
VLDL Cholesterol Cal: 39 mg/dL (ref 5–40)

## 2024-03-17 LAB — ALT: ALT: 10 IU/L (ref 0–32)

## 2024-03-19 ENCOUNTER — Telehealth: Payer: Self-pay

## 2024-03-19 MED ORDER — NITROGLYCERIN 0.4 MG SL SUBL: 0.4000 mg | SUBLINGUAL_TABLET | SUBLINGUAL | 3 refills | Status: AC | PRN

## 2024-03-19 NOTE — Telephone Encounter (Signed)
-----   Message from Christell Constant sent at 03/18/2024 12:04 PM EDT ----- DDX includes non cardiac pain (see phone note below) vs microvascular disease).  Trial of nitroglycerin as PRN is reasonable.  If helpful, at follow up can eval for microvascular disease (smoking cessation would be needed)  Riley Lam, MD FASE [facility] Cardiologist [facility]  [address] [place], [address] [place], [place] [address] [phone]  12:05 PM ----- Message ----- From: Interface, [facility] Lab Results In Sent: 03/17/2024   7:35 PM EDT To: Christell Constant, MD

## 2024-03-19 NOTE — Telephone Encounter (Signed)
 Called pt advised of MD response:  DDX includes non cardiac pain (see phone note below) vs microvascular disease).  Trial of nitroglycerin as PRN is reasonable.  If helpful, at follow up can eval for microvascular disease (smoking cessation would be needed)

## 2024-04-08 ENCOUNTER — Ambulatory Visit: Attending: Internal Medicine | Admitting: Internal Medicine

## 2024-04-08 VITALS — BP 108/60 | HR 64 | Ht 65.5 in | Wt 231.0 lb

## 2024-04-08 DIAGNOSIS — Z8249 Family history of ischemic heart disease and other diseases of the circulatory system: Secondary | ICD-10-CM

## 2024-04-08 DIAGNOSIS — R072 Precordial pain: Secondary | ICD-10-CM | POA: Diagnosis not present

## 2024-04-08 DIAGNOSIS — Z01812 Encounter for preprocedural laboratory examination: Secondary | ICD-10-CM | POA: Diagnosis not present

## 2024-04-08 MED ORDER — AMLODIPINE BESYLATE 2.5 MG PO TABS: 2.5000 mg | ORAL_TABLET | Freq: Every day | ORAL | 3 refills | Status: AC

## 2024-04-08 NOTE — Patient Instructions (Signed)
 Medication Instructions:  Your physician has recommended you make the following change in your medication:   1) START amlodipine 2.5 mg daily  *If you need a refill on your cardiac medications before your next appointment, please call your pharmacy*  Lab Work: TODAY: CBC, BMET If you have labs (blood work) drawn today and your tests are completely normal, you will receive your results only by: MyChart Message (if you have MyChart) OR A paper copy in the mail If you have any lab test that is abnormal or we need to change your treatment, we will call you to review the results.  Testing/Procedures: Your physician has requested that you have a cardiac catheterization. Cardiac catheterization is used to diagnose and/or treat various heart conditions. Doctors may recommend this procedure for a number of different reasons. The most common reason is to evaluate chest pain. Chest pain can be a symptom of coronary artery disease (CAD), and cardiac catheterization can show whether plaque is narrowing or blocking your heart's arteries. This procedure is also used to evaluate the valves, as well as measure the blood flow and oxygen levels in different parts of your heart. For further information please visit [email or web address] Please follow instruction sheet, as given.   Follow-Up: At [facility], you and your health needs are our priority.  As part of our continuing mission to provide you with exceptional heart care, we have created designated Provider Care Teams.  These Care Teams include your primary Cardiologist (physician) and Advanced Practice Providers (APPs -  Physician Assistants and Nurse Practitioners) who all work together to provide you with the care you need, when you need it.  Your next appointment:   6-8 week(s)  The format for your next appointment:   In Person  Provider:   Jari Favre, PA-C, Ronie Spies, PA-C, Jacolyn Reedy, PA-C, or Tereso Newcomer, PA-C       Other Instructions      Cardiac/Peripheral Catheterization   You are scheduled for a Cardiac Catheterization on Monday, April 28 with Dr.  Truett Mainland .  1. Please arrive at the [facility] (Main Entrance A) at [facility]: [address] [place], [place] [address] at 7:00 AM (This time is 2 hour(s) before your procedure to ensure your preparation).   Free valet parking service is available. You will check in at ADMITTING. The support person will be asked to wait in the waiting room.  It is OK to have someone drop you off and come back when you are ready to be discharged.        Special note: Every effort is made to have your procedure done on time. Please understand that emergencies sometimes delay scheduled procedures.  2. Diet: Do not eat solid foods after midnight.  You may have clear liquids until 5 AM the day of the procedure.  3. Labs: TODAY (CBC, BMET)  4. Medication instructions in preparation for your procedure:   Contrast Allergy: No  On the morning of your procedure, take Aspirin 81 mg and any morning medicines NOT listed above.  You may use sips of water.  5. Plan to go home the same day, you will only stay overnight if medically necessary. 6. You MUST have a responsible adult to drive you home. 7. An adult MUST be with you the first 24 hours after you arrive home. 8. Bring a current list of your medications, and the last time and date medication taken. 9. Bring ID and current insurance cards. 10.Please wear  clothes that are easy to get on and off and wear slip-on shoes.  Thank you for allowing [place] to care for you!   -- [facility]        1st Floor: - Lobby - Registration  - Pharmacy  - Lab - Cafe  2nd Floor: - PV Lab - Diagnostic Testing (echo, CT, nuclear med)  3rd Floor: - Vacant  4th Floor: - TCTS (cardiothoracic surgery) - AFib Clinic - Structural Heart Clinic - Vascular Surgery  - Vascular Ultrasound  5th Floor: -  HeartCare Cardiology (general and EP) - Clinical Pharmacy for coumadin, hypertension, lipid, weight-loss medications, and med management appointments    Valet parking services will be available as well.

## 2024-04-08 NOTE — Progress Notes (Signed)
 Cardiology Office Note:  .    Date:  04/08/2024  ID:  Amber Conrad, DOB [birth year], MRN [id number] PCP: Amber Mask, MD  [facility] Providers Cardiologist:  Christell Constant, MD     CC: Chest pain follow up  History of Present Illness: .    Amber Conrad is a 53 y.o. female with a history of smoking and family history of CAD presenting with chest pain  Despite not having obstructive coronary disease, she experiences ongoing chest pain described as spasms with associated pressure. Despite the absence of obstructive coronary disease, these symptoms persist and occur daily.  She has tried PRN vasodilators and they improve sx but cause CP. A previous CT scan revealed soft plaque and mild nonobstructive coronary disease.  She has a history of smoking and has attempted smoking cessation with Chantix twice, but it was unsuccessful.  Her cholesterol is currently at goal with her current therapy.  Relevant histories: .  Social- long time smoker Family history: - Father had coronary artery disease in 33s (he is deceased) - Mother has atrial fibrillation - Mother has patent foramen ovale ROS: As per HPI.   Studies Reviewed: .   Cardiac Studies & Procedures   ______________________________________________________________________________________________          CT SCANS  CT CORONARY MORPH W/CTA COR W/SCORE 10/09/2023  Addendum 11/01/2023  2:19 PM ADDENDUM REPORT: 11/01/2023 14:17  EXAM: OVER-READ INTERPRETATION  CT CHEST  The following report is an over-read performed by radiologist Dr. Curly Shores [facility] on 11/01/2023. This over-read does not include interpretation of cardiac or coronary anatomy or pathology. The coronary CTA interpretation by the cardiologist is attached.  COMPARISON:  06/26/2020.  FINDINGS: Cardiovascular:  See findings discussed in the body of the report.  Mediastinum/Nodes: No suspicious adenopathy  identified. Imaged mediastinal structures are unremarkable.  Lungs/Pleura: There is dependent basilar subsegmental atelectasis. No pneumonia or pulmonary edema. No pleural effusion or pneumothorax.  Upper Abdomen: Small hiatal hernia.  Musculoskeletal: No chest wall abnormality. No acute osseous findings.  IMPRESSION: Small hiatal hernia.   Electronically Signed By: Layla Maw M.D. On: 11/01/2023 14:17  Narrative HISTORY: Chest pain, nonspecific  EXAM: Cardiac/Coronary  CT  TECHNIQUE: The patient was scanned on a [employer].  PROTOCOL: A 120 kV prospective scan was triggered in the descending thoracic aorta at 111 HU's. Axial non-contrast 3 mm slices were carried out through the heart. The data set was analyzed on a dedicated work station and scored using the Agatston method. Gantry rotation speed was 250 msecs and collimation was .6 mm. Beta blockade and 0.8 mg of sl NTG was given. The 3D data set was reconstructed in 5% intervals of the 35-75 % of the R-R cycle. Systolic and diastolic phases were analyzed on a dedicated work station using MPR, MIP and VRT modes. The patient received contrast: 95mL OMNIPAQUE IOHEXOL 350 MG/ML SOLN.  FINDINGS: Image quality: Good  Noise artifact is: Limited  Coronary calcium score is 0.  Coronary arteries: Normal coronary origins.  Right dominance.  Right Coronary Artery: Mild plaque in the distal RCA, 25-49% stenosis.  Left Main Coronary Artery: No detectable plaque or stenosis.  Left Anterior Descending Coronary Artery: No detectable plaque or stenosis.  Left Circumflex Artery: No detectable plaque or stenosis.  Aorta: Normal size, 26 mm at the mid ascending aorta (level of the PA bifurcation) measured double oblique.  Aortic Valve: No calcifications.  Other findings:  Normal pulmonary vein drainage  into the left atrium.  Normal left atrial appendage without thrombus.  Normal size of the  pulmonary artery.  Please see separate report from [facility] for non-cardiac findings.  IMPRESSION: 1. Mild CAD in distal RCA, 25-49% stenosis, CADRADS 2.  2. Total plaque volume 26 mm3 which is 53rd percentile for age- and sex-matched controls (calcified plaque 0 mm3; non-calcified plaque 26 mm3). TPV is mild.  3. Coronary calcium score of 0.  4. Normal coronary origins with right dominance.  RECOMMENDATIONS: CAD-RADS 2. Mild non-obstructive CAD (25-49%). Consider non-atherosclerotic causes of chest pain. Consider preventive therapy and risk factor modification.  Electronically Signed: By: Weston Brass M.D. On: 10/09/2023 15:17     ______________________________________________________________________________________________         Physical Exam:    VS:  BP 108/60 (BP Location: Right Arm)   Pulse 64   Ht 5' 5.5" (1.664 m)   Wt 104.8 kg   SpO2 95%   BMI 37.86 kg/m    Wt Readings from Last 3 Encounters:  04/08/24 104.8 kg  10/01/23 102.5 kg  07/01/23 102.8 kg    Gen: no distress, obesity   Neck: No JVD Cardiac: No Rubs or Gallops, no murmur, RRR +2 radial pulses Respiratory: Expiratory wheezes bilaterally, normal effort, normal  respiratory rate GI: Soft, nontender, non-distended  MS: No  edema;  moves all extremities Integument: Skin feels warm Neuro:  At time of evaluation, alert and oriented to person/place/time/situation  Psych: Anxious affect, patient feels anxious   ASSESSMENT AND PLAN: .    CAD Coronary vasospasm   She presents with spasmodic chest pain and pressure, despite no obstructive coronary artery disease. There is concern for coronary vasospasm, Smoking is a significant risk factor.   - Perform heart catheterization with vasoreactive protocol   - Initiate amlodipine 2.5 mg PO daily   - Instruct to report any signs of symptomatic hypotension and stop medication if she experiences them   - Arrange follow-up  post-catheterization to assess for coronary vasospasm  (if no vasospasm, need PCP follow for non-cardiac eval)  Smoking cessation   She has been unable to quit smoking, a significant risk factor for coronary vasospasm and other cardiovascular diseases. Previous attempts with Chantix were unsuccessful.   - Provide support for smoking cessation   - Explore alternative smoking cessation therapies (did not wish to pursue health coaching at last eval)  Hyperlipidemia -LDL goal under 70 due to aortic atherosclerosis. - at goal on therapy  APP post cath  Risks and benefits of cardiac catheterization have been discussed with the patient.  These include bleeding, infection, kidney damage, stroke, heart attack, death.  The patient understands these risks and is willing to proceed.  Access recommendations: R radial (risk of vasospasm) vs  Femoral Procedural considerations: If no catheter direct spasm, please perform CMR/IMR Ursula Alert wire testing    Riley Lam, MD FASE [facility] Cardiologist [facility]  [address] [place], [address] [place], [place] [address] [phone]  1:01 PM

## 2024-04-12 LAB — CBC

## 2024-04-13 ENCOUNTER — Encounter: Payer: Self-pay | Admitting: Internal Medicine

## 2024-04-13 LAB — CBC
Hematocrit: 42.4 % (ref 34.0–46.6)
Hemoglobin: 13.7 g/dL (ref 11.1–15.9)
MCH: 28 pg (ref 26.6–33.0)
MCHC: 32.3 g/dL (ref 31.5–35.7)
MCV: 87 fL (ref 79–97)
Platelets: 427 10*3/uL (ref 150–450)
RBC: 4.9 x10E6/uL (ref 3.77–5.28)
RDW: 13.2 % (ref 11.7–15.4)
WBC: 9.1 10*3/uL (ref 3.4–10.8)

## 2024-04-13 LAB — BASIC METABOLIC PANEL WITH GFR
BUN/Creatinine Ratio: 10 (ref 9–23)
BUN: 7 mg/dL (ref 6–24)
CO2: 26 mmol/L (ref 20–29)
Calcium: 9.3 mg/dL (ref 8.7–10.2)
Chloride: 105 mmol/L (ref 96–106)
Creatinine, Ser: 0.7 mg/dL (ref 0.57–1.00)
Glucose: 101 mg/dL — ABNORMAL HIGH (ref 70–99)
Potassium: 4.5 mmol/L (ref 3.5–5.2)
Sodium: 143 mmol/L (ref 134–144)
eGFR: 103 mL/min/{1.73_m2} (ref >59)

## 2024-04-19 ENCOUNTER — Telehealth: Payer: Self-pay | Admitting: Cardiology

## 2024-04-19 ENCOUNTER — Encounter: Payer: Self-pay | Admitting: Internal Medicine

## 2024-04-19 NOTE — Telephone Encounter (Signed)
 Spoke with the patient who states that she would like to cancel her heart cath for right now. She states that she saw her PCP and she is going to get a GI workup to see if her symptoms are coming from a hiatal hernia. She states that she will call us  back after she sees GI if she wishes to proceed with cath or other further cardiac testing.

## 2024-04-19 NOTE — Telephone Encounter (Signed)
 Error

## 2024-04-19 NOTE — Telephone Encounter (Signed)
 Patient would like to cancel 4/28 procedure with Dr. Filiberto Hug.

## 2024-04-26 ENCOUNTER — Ambulatory Visit (HOSPITAL_COMMUNITY): Admission: RE | Admit: 2024-04-26 | Source: Home / Self Care | Admitting: Cardiology

## 2024-04-26 ENCOUNTER — Encounter (HOSPITAL_COMMUNITY): Admission: RE | Payer: Self-pay | Source: Home / Self Care

## 2024-04-26 SURGERY — LEFT HEART CATH AND CORONARY ANGIOGRAPHY
Anesthesia: LOCAL

## 2024-05-20 ENCOUNTER — Other Ambulatory Visit: Payer: Self-pay | Admitting: Nurse Practitioner

## 2024-05-20 DIAGNOSIS — R1013 Epigastric pain: Secondary | ICD-10-CM

## 2024-05-21 ENCOUNTER — Telehealth: Payer: Self-pay

## 2024-05-21 NOTE — Telephone Encounter (Signed)
   Name: Amber Conrad  DOB: Dec 18, 1971  MRN: [id number]  Primary Cardiologist: Jann Melody, MD  Chart reviewed as part of pre-operative protocol coverage. The patient has an upcoming visit scheduled with Marlyse Single, PA on 05/31/2024 at which time clearance can be addressed in case there are any issues that would impact surgical recommendations.   I added preop FYI to appointment note so that provider is aware to address at time of outpatient visit.  Per office protocol the cardiology provider should forward their finalized clearance decision and recommendations regarding antiplatelet therapy to the requesting party below.     I will route this message as FYI to requesting party and remove this message from the preop box as separate preop APP input not needed at this time.   Please call with any questions.  Francene Ing, Retha Cast, NP  05/21/2024, 8:46 AM

## 2024-05-21 NOTE — Telephone Encounter (Signed)
   Pre-operative Risk Assessment    Patient Name: Amber Conrad  DOB: 1971-05-14 MRN: [id number]   Date of last office visit: 04/08/24 Date of next office visit: 05/31/24   Request for Surgical Clearance    Procedure:  Colonoscopy/endoscopy   Date of Surgery:  Clearance TBD                                 Surgeon:  Dr. Genell Ken   Surgeon's Group or Practice Name:  [facility]  Phone number:  [phone] Fax number:  [phone]   Type of Clearance Requested:   - Medical    Type of Anesthesia:  Propofol     Additional requests/questions:    [name]   05/21/2024, 8:41 AM

## 2024-05-30 ENCOUNTER — Encounter: Payer: Self-pay | Admitting: Physician Assistant

## 2024-05-30 DIAGNOSIS — I2510 Atherosclerotic heart disease of native coronary artery without angina pectoris: Secondary | ICD-10-CM | POA: Insufficient documentation

## 2024-05-30 HISTORY — DX: Atherosclerotic heart disease of native coronary artery without angina pectoris: I25.10

## 2024-05-30 NOTE — Progress Notes (Unsigned)
 OFFICE NOTE Date:  05/31/2024  ID:  Amber Conrad, DOB 1971-10-12, MRN [id number] PCP: Amber Chamorro, MD  [facility] Providers Cardiologist:  Amber Melody, MD     Patient Profile:     Coronary artery disease  CCTA 10/09/23: CAC score 0; mild nonobstructive CAD; TPV 26 mm3 (53 percentile), TPV mild; [dRCA 25-49]; aorta 26 mm Hyperlipidemia  Chronic Obstructive Pulmonary Disease Tobacco use  GERD FHx CAD        Discussed the use of AI scribe software for clinical note transcription with the patient, who gave verbal consent to proceed. History of Present Illness Amber Conrad is a 53 y.o. female who returns for surgical clearance. He was last seen by Amber Conrad 04/08/24. She continued to have episodes of chest pain described as spasms. She was started on Amlodipine  and set up for cardiac catheterization. The patient canceled the procedure. She needs a colonoscopy and EGD with Amber Conrad.   She is here alone. She experiences episodes of chest pain described as spasms, occurring randomly and sometimes triggered by emotional stress (but not always). The pain is not consistently triggered by physical activity, though she has limitations due to neck and lower back issues. The pain sometimes improves with BC powder or nitroglycerin , though nitroglycerin  causes headaches. These symptoms have persisted for several months without significant worsening. She has seen GI and placed on new medications. These have not alleviated her chest symptoms. She experiences acid reflux and burning after eating, sometimes coinciding with chest pain. She has had hematemesis in the past. No recent hematemesis or melena. She experiences shortness of breath with regular activities and cannot walk a block or two on level ground without stopping. She can perform household activities like vacuuming and washing dishes. She can go up a flight of steps. She experiences wheezing, attributed to  smoking, and occasional ankle swelling by the end of the day.   ROS-See HPI    Studies Reviewed:  EKG Interpretation Date/Time:  Monday May 31 2024 10:10:47 EDT Ventricular Rate:  73 PR Interval:  178 QRS Duration:  70 QT Interval:  394 QTC Calculation: 434 R Axis:   -19  Text Interpretation: Normal sinus rhythm Low voltage QRS Inferior Q waves noted No significant change since last tracing dated 04/08/24 Confirmed by Amber Conrad [phone]) on 05/31/2024 10:29:16 AM   Results LABS LDL: 69 (02/2024)  Risk Assessment/Calculations:          Physical Exam:  VS:  BP 102/60   Pulse 73   Ht 5\' 5"  (1.651 m)   Wt 231 lb (104.8 kg)   SpO2 95%   BMI 38.44 kg/m    Wt Readings from Last 3 Encounters:  05/31/24 231 lb (104.8 kg)  04/08/24 231 lb (104.8 kg)  10/01/23 226 lb (102.5 kg)    Constitutional:      Appearance: Healthy appearance. Not in distress.  Neck:     Vascular: JVD normal.  Pulmonary:     Breath sounds: Normal breath sounds. No wheezing. No rales.  Cardiovascular:     Normal rate. Regular rhythm.     Murmurs: There is a grade 1/6 systolic murmur at the URSB.  Edema:    Peripheral edema absent.  Abdominal:     Palpations: Abdomen is soft.      Assessment and Plan: Assessment & Plan Preoperative cardiovascular examination [name]'s perioperative risk of a major cardiac event is 0.9% according to the Revised Cardiac Risk Index (RCRI).  Therefore, she is at low risk for perioperative complications.   Her functional capacity is fair at 4.73 METs according to the Duke Activity Status Index (DASI). Recommendations: According to ACC/AHA guidelines, no further cardiovascular testing needed.  The patient may proceed to surgery at acceptable risk.   Coronary artery disease involving native coronary artery of native heart without angina pectoris Mild non-obstructive noncalcific plaque in the distal RCA identified on coronary CTA in October 2024.  She continues to have  episodes of chest discomfort.  There has been no significant change over the last several months.  Previously, cardiac catheterization with vasoactive protocol was recommended to evaluate for spasm.  The patient decided to cancel this test and pursue GI workup.  Overall, she seems to have more symptoms associated with indigestion than anything else.  It seems reasonable to pursue GI workup first.  EGD/colonoscopy are overall low risk procedures.  Her RCRI is low at 1 indicating a 0.9% risk of perioperative major cardiac event.  She can achieve more than 4 METS.  I reviewed her case today with Dr. Abel Conrad (attending MD).  We agree it is safe for her to proceed with EGD and colonoscopy at acceptable CV risk.   - Proceed with endoscopy and colonoscopy without further cardiovascular testing. - Continue rosuvastatin  5 mg daily. - Continue amlodipine  2.5 mg daily, nitroglycerin  as needed - Follow up 6 months Murmur, cardiac Soft systolic murmur noted on exam. 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 Will get an echocardiogram to evaluate. The echocardiogram does not need to be done prior to her EGD/Colo. Pure hypercholesterolemia Hyperlipidemia managed with rosuvastatin . LDL was optimal at 69 mg/dL in March 9562. - Continue rosuvastatin  5 mg daily.        Dispo:  Return in about 6 months (around 11/30/2024) for Routine Follow Up, w/ Amber Conrad, or Amber Single, PA-C. Signed, Amber Single, PA-C

## 2024-05-31 ENCOUNTER — Encounter: Payer: Self-pay | Admitting: Physician Assistant

## 2024-05-31 ENCOUNTER — Ambulatory Visit: Attending: Physician Assistant | Admitting: Physician Assistant

## 2024-05-31 VITALS — BP 102/60 | HR 73 | Ht 65.0 in | Wt 231.0 lb

## 2024-05-31 DIAGNOSIS — E7800 Pure hypercholesterolemia, unspecified: Secondary | ICD-10-CM | POA: Diagnosis not present

## 2024-05-31 DIAGNOSIS — Z01810 Encounter for preprocedural cardiovascular examination: Secondary | ICD-10-CM | POA: Diagnosis not present

## 2024-05-31 DIAGNOSIS — R011 Cardiac murmur, unspecified: Secondary | ICD-10-CM | POA: Diagnosis not present

## 2024-05-31 DIAGNOSIS — I2510 Atherosclerotic heart disease of native coronary artery without angina pectoris: Secondary | ICD-10-CM

## 2024-05-31 NOTE — Assessment & Plan Note (Signed)
 Mild non-obstructive noncalcific plaque in the distal RCA identified on coronary CTA in October 2024.  She continues to have episodes of chest discomfort.  There has been no significant change over the last several months.  Previously, cardiac catheterization with vasoactive protocol was recommended to evaluate for spasm.  The patient decided to cancel this test and pursue GI workup.  Overall, she seems to have more symptoms associated with indigestion than anything else.  It seems reasonable to pursue GI workup first.  EGD/colonoscopy are overall low risk procedures.  Her RCRI is low at 1 indicating a 0.9% risk of perioperative major cardiac event.  She can achieve more than 4 METS.  I reviewed her case today with Dr. Abel Hoe (attending MD).  We agree it is safe for her to proceed with EGD and colonoscopy at acceptable CV risk.   - Proceed with endoscopy and colonoscopy without further cardiovascular testing. - Continue rosuvastatin  5 mg daily. - Continue amlodipine  2.5 mg daily, nitroglycerin  as needed - Follow up 6 months

## 2024-05-31 NOTE — Patient Instructions (Signed)
 Medication Instructions:  Your physician recommends that you continue on your current medications as directed. Please refer to the Current Medication list given to you today. *If you need a refill on your cardiac medications before your next appointment, please call your pharmacy*  Lab Work: None ordered If you have labs (blood work) drawn today and your tests are completely normal, you will receive your results only by: MyChart Message (if you have MyChart) OR A paper copy in the mail If you have any lab test that is abnormal or we need to change your treatment, we will call you to review the results.  Testing/Procedures: Your physician has requested that you have an echocardiogram. Echocardiography is a painless test that uses sound waves to create images of your heart. It provides your doctor with information about the size and shape of your heart and how well your heart's chambers and valves are working. This procedure takes approximately one hour. There are no restrictions for this procedure. Please do NOT wear cologne, perfume, aftershave, or lotions (deodorant is allowed). Please arrive 15 minutes prior to your appointment time.  Please note: We ask at that you not bring children with you during ultrasound (echo/ vascular) testing. Due to room size and safety concerns, children are not allowed in the ultrasound rooms during exams. Our front office staff cannot provide observation of children in our lobby area while testing is being conducted. An adult accompanying a patient to their appointment will only be allowed in the ultrasound room at the discretion of the ultrasound technician under special circumstances. We apologize for any inconvenience.  Follow-Up: At [facility], you and your health needs are our priority.  As part of our continuing mission to provide you with exceptional heart care, our providers are all part of one team.  This team includes your primary Cardiologist  (physician) and Advanced Practice Providers or APPs (Physician Assistants and Nurse Practitioners) who all work together to provide you with the care you need, when you need it.  Your next appointment:   6 month(s)  Provider:   Jann Melody, MD    We recommend signing up for the patient portal called "MyChart".  Sign up information is provided on this After Visit Summary.  MyChart is used to connect with patients for Virtual Visits (Telemedicine).  Patients are able to view lab/test results, encounter notes, upcoming appointments, etc.  Non-urgent messages can be sent to your provider as well.   To learn more about what you can do with MyChart, go to [email or web address].   Other Instructions

## 2024-06-03 ENCOUNTER — Ambulatory Visit
Admission: RE | Admit: 2024-06-03 | Discharge: 2024-06-03 | Disposition: A | Discharge status: Home / Self Care | Source: Ambulatory Visit | Attending: Nurse Practitioner | Admitting: Nurse Practitioner

## 2024-06-03 DIAGNOSIS — R1013 Epigastric pain: Secondary | ICD-10-CM

## 2024-07-15 ENCOUNTER — Ambulatory Visit (HOSPITAL_COMMUNITY)
Admission: RE | Admit: 2024-07-15 | Discharge: 2024-07-15 | Disposition: A | Discharge status: Home / Self Care | Source: Ambulatory Visit | Attending: Cardiology | Admitting: Cardiology

## 2024-07-15 ENCOUNTER — Ambulatory Visit: Payer: Self-pay | Admitting: Physician Assistant

## 2024-07-15 DIAGNOSIS — R011 Cardiac murmur, unspecified: Secondary | ICD-10-CM | POA: Diagnosis present

## 2024-07-15 LAB — ECHOCARDIOGRAM COMPLETE
Area-P 1/2: 3.39 cm2
S' Lateral: 2.45 cm

## 2024-08-26 ENCOUNTER — Other Ambulatory Visit (HOSPITAL_COMMUNITY): Payer: Self-pay | Admitting: General Surgery

## 2024-08-26 DIAGNOSIS — K219 Gastro-esophageal reflux disease without esophagitis: Secondary | ICD-10-CM

## 2024-08-26 DIAGNOSIS — E66812 Obesity, class 2: Secondary | ICD-10-CM

## 2024-09-14 ENCOUNTER — Encounter (HOSPITAL_COMMUNITY)

## 2024-09-25 ENCOUNTER — Other Ambulatory Visit: Payer: Self-pay | Admitting: Internal Medicine

## 2024-09-28 ENCOUNTER — Encounter (HOSPITAL_COMMUNITY)

## 2024-09-28 ENCOUNTER — Encounter (HOSPITAL_COMMUNITY): Payer: Self-pay

## 2024-11-03 ENCOUNTER — Telehealth (HOSPITAL_COMMUNITY): Payer: Self-pay | Admitting: General Surgery

## 2024-11-03 ENCOUNTER — Encounter (HOSPITAL_COMMUNITY): Payer: Self-pay

## 2024-11-03 NOTE — Telephone Encounter (Signed)
 Patient has not called back to reschedule her GES ok to cancel this order per Dr Tanda

## 2024-11-15 ENCOUNTER — Ambulatory Visit: Admitting: Physician Assistant

## 2024-12-27 ENCOUNTER — Other Ambulatory Visit (HOSPITAL_COMMUNITY): Payer: Self-pay | Admitting: General Surgery

## 2024-12-27 DIAGNOSIS — E66812 Obesity, class 2: Secondary | ICD-10-CM

## 2024-12-27 DIAGNOSIS — K219 Gastro-esophageal reflux disease without esophagitis: Secondary | ICD-10-CM

## 2025-01-04 ENCOUNTER — Ambulatory Visit (INDEPENDENT_AMBULATORY_CARE_PROVIDER_SITE_OTHER): Admitting: Podiatry

## 2025-01-04 DIAGNOSIS — M2041 Other hammer toe(s) (acquired), right foot: Secondary | ICD-10-CM

## 2025-01-04 DIAGNOSIS — L600 Ingrowing nail: Secondary | ICD-10-CM

## 2025-01-04 DIAGNOSIS — M21619 Bunion of unspecified foot: Secondary | ICD-10-CM | POA: Diagnosis not present

## 2025-01-04 DIAGNOSIS — M2042 Other hammer toe(s) (acquired), left foot: Secondary | ICD-10-CM | POA: Diagnosis not present

## 2025-01-04 NOTE — Patient Instructions (Signed)

## 2025-01-04 NOTE — Progress Notes (Unsigned)
 Subjective:   Patient ID: Amber Conrad, female   DOB: 54 y.o.   MRN: [id number]   HPI Chief Complaint  Patient presents with   Ingrown Toenail    L hallux ingrown toe nail   54 year old female presents the office with concerns of ingrown toenail left big toe.  She states she has had the nail corners removed twice and had the nail removed before in total.  Area gets tender on the corners but denies any drainage or pus at this time.  She also while she is here today brings up her hammertoes, bunion on the right foot.  Occasionally she does get some discomfort in the bunion itself.  No recent injuries or treatment.   Review of Systems  All other systems reviewed and are negative.  Past Medical History:  Diagnosis Date   Arthritis    CAD (coronary artery disease) 05/30/2024   CCTA 10/09/23: CAC score 0; mild nonobstructive CAD; TPV 26 mm3 (53 percentile), TPV mild; [dRCA 25-49]; aorta 26 mm    COPD (chronic obstructive pulmonary disease) (HCC)    GERD (gastroesophageal reflux disease)    PONV (postoperative nausea and vomiting)    Ulcer     Past Surgical History:  Procedure Laterality Date   ABDOMINAL HYSTERECTOMY     BACK SURGERY     L5-S1 microdiscectomy   CHOLECYSTECTOMY     before 2013   LUMBAR LAMINECTOMY/DECOMPRESSION MICRODISCECTOMY Left 10/14/2022   Procedure: Microdiscectomy re-do  - left - Lumbar Five-Sacral One;  Surgeon: Joshua Alm RAMAN, MD;  Location: [facility] OR;  Service: Neurosurgery;  Laterality: Left;  3C   NECK SURGERY     C5/6    Current Medications[1]  Allergies[2]        Objective:  Physical Exam  General: AAO x3, NAD  Dermatological: Left hallux toenails hypertrophic, dystrophic with yellow discoloration there is incurvation present both medial, lateral nail borders with localized edema but there is no erythema, drainage or pus or signs of infection.  No ascending cellulitis.  No open lesions.  Vascular: Dorsalis Pedis artery and Posterior Tibial  artery pedal pulses are 2/4 bilateral with immedate capillary fill time. There is no pain with calf compression, swelling, warmth, erythema.   Neruologic: Grossly intact via light touch bilateral.   Musculoskeletal: Bunion, hammertoe deformities noted right side worse than left.  Mild tenderness palpation of the bunion itself.  No areas of pinpoint tenderness.       Assessment:   Left hallux ingrown toenail, onychodystrophy; right foot bunion, hammertoe     Plan:  -Treatment options discussed including all alternatives, risks, and complications -Etiology of symptoms were discussed -At this time, the patient is requesting partial nail removal with chemical matricectomy to the symptomatic portion of the nail. Risks and complications were discussed with the patient for which they understand and written consent was obtained. Under sterile conditions a total of 3 mL of a mixture of 2% lidocaine  plain and 0.5% Marcaine  plain was infiltrated in a hallux block fashion. Once anesthetized, the skin was prepped in sterile fashion. A tourniquet was then applied. Next the medial, lateral aspect of hallux nail border was then sharply excised making sure to remove the entire offending nail border. Once the nails were ensured to be removed area was debrided and the underlying skin was intact. There is no purulence identified in the procedure. Next phenol was then applied under standard conditions and copiously irrigated.  Betadine ointment was applied. A dry sterile dressing was applied.  After application of the dressing the tourniquet was removed and there is found to be an immediate capillary refill time to the digit. The patient tolerated the procedure well any complications. Post procedure instructions were discussed the patient for which he verbally understood. Discussed signs/symptoms of infection and directed to call the office immediately should any occur or go directly to the emergency room. In the  meantime, encouraged to call the office with any questions, concerns, changes symptoms. -Nail sent for culture  - For the bunion, hammertoe on the right foot we discussed shoe modifications, offloading pads.  In the future consider surgical intervention if needed.    Amber Conrad DPM     [1]  Current Outpatient Medications:    albuterol  (VENTOLIN  HFA) 108 (90 Base) MCG/ACT inhaler, Inhale 2 puffs into the lungs every 6 (six) hours as needed for wheezing or shortness of breath., Disp: , Rfl:    amLODipine  (NORVASC ) 2.5 MG tablet, Take 1 tablet (2.5 mg total) by mouth daily., Disp: 90 tablet, Rfl: 3   amphetamine -dextroamphetamine  (ADDERALL) 20 MG tablet, Take 20 mg by mouth daily as needed (focusing)., Disp: , Rfl:    Aspirin-Caffeine (BC FAST PAIN RELIEF PO), Take 1-2 packets by mouth 3 (three) times daily as needed (pain)., Disp: , Rfl:    Budeson-Glycopyrrol-Formoterol  (BREZTRI AEROSPHERE) 160-9-4.8 MCG/ACT AERO, Inhale 2 puffs into the lungs 2 (two) times daily., Disp: , Rfl:    calcium  carbonate (TUMS EX) 750 MG chewable tablet, Chew 2-3 tablets by mouth 3 (three) times daily as needed for heartburn., Disp: , Rfl:    doxycycline (VIBRAMYCIN) 100 MG capsule, Take 100 mg by mouth as needed (Acne)., Disp: , Rfl:    DULoxetine (CYMBALTA) 60 MG capsule, Take 30 mg by mouth daily., Disp: , Rfl:    HYDROcodone -acetaminophen  (NORCO) 7.5-325 MG tablet, Take 1 tablet by mouth as needed for moderate pain (pain score 4-6) or severe pain (pain score 7-10)., Disp: , Rfl:    hydrOXYzine (VISTARIL) 25 MG capsule, Take by mouth as needed for itching or anxiety., Disp: , Rfl:    Melatonin 5 MG CHEW, Chew 5-10 mg by mouth at bedtime as needed (sleep)., Disp: , Rfl:    nitroGLYCERIN  (NITROSTAT ) 0.4 MG SL tablet, Place 1 tablet (0.4 mg total) under the tongue every 5 (five) minutes as needed (may take up to 3 tablets in 15 minutes)., Disp: 25 tablet, Rfl: 3   pantoprazole  (PROTONIX ) 40 MG tablet, Take 40  mg by mouth daily., Disp: , Rfl:    rosuvastatin  (CRESTOR ) 5 MG tablet, TAKE 1 TABLET BY MOUTH DAILY, Disp: 90 tablet, Rfl: 2   WEGOVY 0.25 MG/0.5ML SOAJ, Inject 0.25 mg into the skin once a week., Disp: , Rfl:  [2]  Allergies Allergen Reactions   Azithromycin     Unknown reaction  Other Reaction(s): unknown per pt   Sulfa Antibiotics     Unknown reaction  Other Reaction(s): unknown per pt   Tramadol     Unknown reaction  Other Reaction(s): unknown per pt

## 2025-01-11 ENCOUNTER — Encounter (HOSPITAL_COMMUNITY): Payer: Self-pay

## 2025-01-11 ENCOUNTER — Encounter (HOSPITAL_COMMUNITY)
Admission: RE | Admit: 2025-01-11 | Discharge: 2025-01-11 | Disposition: A | Discharge status: Home / Self Care | Source: Ambulatory Visit | Attending: General Surgery | Admitting: General Surgery

## 2025-01-11 DIAGNOSIS — K219 Gastro-esophageal reflux disease without esophagitis: Secondary | ICD-10-CM | POA: Diagnosis present

## 2025-01-11 DIAGNOSIS — K449 Diaphragmatic hernia without obstruction or gangrene: Secondary | ICD-10-CM | POA: Diagnosis present

## 2025-01-11 DIAGNOSIS — E66812 Obesity, class 2: Secondary | ICD-10-CM | POA: Insufficient documentation

## 2025-01-11 MED ORDER — TECHNETIUM TC 99M SULFUR COLLOID
2.0000 | Freq: Once | INTRAVENOUS | Status: AC
  Administered 2025-01-11: 2.2 via ORAL

## 2025-01-31 ENCOUNTER — Encounter: Payer: Self-pay | Admitting: *Deleted

## 2025-01-31 DIAGNOSIS — E7800 Pure hypercholesterolemia, unspecified: Secondary | ICD-10-CM | POA: Insufficient documentation

## 2025-01-31 NOTE — Progress Notes (Unsigned)
" °  OFFICE NOTE:    Date:  01/31/2025  ID:  Amber Conrad, DOB 09/15/1971, MRN [id number] PCP: Loring Tanda Mae, MD  [facility] Providers Cardiologist:  Stanly DELENA Leavens, MD { Click to update primary MD,subspecialty MD or APP then REFRESH:1}      *** Coronary artery disease  CCTA 10/09/23: CAC score 0; mild nonobstructive CAD; TPV 26 mm3 (53 percentile), TPV mild; [dRCA 25-49]; aorta 26 mm TTE 07/15/24: EF 60-65, no RWMA, Gr 1 DD, NL RVSF Hyperlipidemia  Chronic Obstructive Pulmonary Disease Tobacco use  GERD FHx CAD         Discussed the use of AI scribe software for clinical note transcription with the patient, who gave verbal consent to proceed. History of Present Illness Amber Conrad is a 54 y.o. female for follow up of CAD. Last seen in 05/2024.     ROS-See HPI***    Studies Reviewed:       10/01/23: Lp(a): 44.6  03/17/24: LDL 69, Trig 236, TC 140, HDL 32 Results  Risk Assessment/Calculations: {Does this patient have ATRIAL FIBRILLATION?[phone]} No BP recorded.  {Refresh Note OR Click here to enter BP  :1}***      Physical Exam:  VS:  There were no vitals taken for this visit.       Wt Readings from Last 3 Encounters:  05/31/24 231 lb (104.8 kg)  04/08/24 231 lb (104.8 kg)  10/01/23 226 lb (102.5 kg)    Physical Exam***     Assessment and Plan:    Assessment & Plan Coronary artery disease involving native coronary artery of native heart without angina pectoris Mild non-obstructive noncalcific plaque in the distal RCA identified on coronary CTA in October 2024.  *** Pure hypercholesterolemia  Assessment and Plan Assessment & Plan    {      :1}    {Are you ordering a CV Procedure (e.g. stress test, cath, DCCV, TEE, etc)?   Press F2        :[id number]}  Dispo:  No follow-ups on file.  Signed, Glendia Ferrier, PA-C   "

## 2025-02-01 ENCOUNTER — Ambulatory Visit: Admitting: Physician Assistant

## 2025-02-03 ENCOUNTER — Ambulatory Visit: Admitting: Podiatry

## 2025-04-19 ENCOUNTER — Ambulatory Visit: Admitting: Physician Assistant
